# Patient Record
Sex: Female | Born: 1975 | Race: White | Hispanic: No | Marital: Married | Smoking: Current every day smoker
Health system: Southern US, Community
[De-identification: ages and names within clinical notes are randomized; demographics above are authoritative.]

## PROBLEM LIST (undated history)

## (undated) DIAGNOSIS — Z87891 Personal history of nicotine dependence: Secondary | ICD-10-CM

## (undated) DIAGNOSIS — Z22330 Carrier of Group B streptococcus: Secondary | ICD-10-CM

## (undated) DIAGNOSIS — Z3480 Encounter for supervision of other normal pregnancy, unspecified trimester: Secondary | ICD-10-CM

## (undated) DIAGNOSIS — Z98891 History of uterine scar from previous surgery: Secondary | ICD-10-CM

## (undated) DIAGNOSIS — Z8619 Personal history of other infectious and parasitic diseases: Secondary | ICD-10-CM

## (undated) DIAGNOSIS — IMO0002 Reserved for concepts with insufficient information to code with codable children: Secondary | ICD-10-CM

## (undated) DIAGNOSIS — F99 Mental disorder, not otherwise specified: Secondary | ICD-10-CM

## (undated) DIAGNOSIS — F419 Anxiety disorder, unspecified: Secondary | ICD-10-CM

## (undated) DIAGNOSIS — N390 Urinary tract infection, site not specified: Secondary | ICD-10-CM

## (undated) DIAGNOSIS — Z331 Pregnant state, incidental: Secondary | ICD-10-CM

## (undated) DIAGNOSIS — O09529 Supervision of elderly multigravida, unspecified trimester: Secondary | ICD-10-CM

## (undated) HISTORY — DX: History of uterine scar from previous surgery: Z98.891

## (undated) HISTORY — DX: Urinary tract infection, site not specified: N39.0

## (undated) HISTORY — DX: Carrier of group B Streptococcus: Z22.330

## (undated) HISTORY — DX: Encounter for supervision of other normal pregnancy, unspecified trimester: Z34.80

## (undated) HISTORY — DX: Supervision of elderly multigravida, unspecified trimester: O09.529

## (undated) HISTORY — PX: DILATION AND CURETTAGE OF UTERUS: SHX78

## (undated) HISTORY — DX: Reserved for concepts with insufficient information to code with codable children: IMO0002

## (undated) HISTORY — DX: Anxiety disorder, unspecified: F41.9

## (undated) HISTORY — DX: Mental disorder, not otherwise specified: F99

## (undated) HISTORY — DX: Pregnant state, incidental: Z33.1

## (undated) HISTORY — PX: MANDIBLE SURGERY: SHX707

## (undated) HISTORY — DX: Personal history of nicotine dependence: Z87.891

## (undated) HISTORY — DX: Personal history of other infectious and parasitic diseases: Z86.19

---

## 1998-08-28 ENCOUNTER — Emergency Department (HOSPITAL_COMMUNITY): Admission: EM | Admit: 1998-08-28 | Discharge: 1998-08-28 | Payer: Self-pay | Admitting: Emergency Medicine

## 2009-03-24 ENCOUNTER — Inpatient Hospital Stay (HOSPITAL_COMMUNITY): Admission: AD | Admit: 2009-03-24 | Discharge: 2009-03-28 | Payer: Self-pay | Admitting: Obstetrics and Gynecology

## 2010-02-05 ENCOUNTER — Ambulatory Visit (HOSPITAL_COMMUNITY): Admission: RE | Admit: 2010-02-05 | Discharge: 2010-02-05 | Payer: Self-pay | Admitting: Emergency Medicine

## 2010-12-31 LAB — CBC
HCT: 27.9 % — ABNORMAL LOW (ref 36.0–46.0)
HCT: 38.5 % (ref 36.0–46.0)
MCHC: 34.5 g/dL (ref 30.0–36.0)
MCV: 88.7 fL (ref 78.0–100.0)
MCV: 90 fL (ref 78.0–100.0)
Platelets: 178 10*3/uL (ref 150–400)
RBC: 3.1 MIL/uL — ABNORMAL LOW (ref 3.87–5.11)
RDW: 14 % (ref 11.5–15.5)
WBC: 14.6 10*3/uL — ABNORMAL HIGH (ref 4.0–10.5)

## 2010-12-31 LAB — RPR: RPR Ser Ql: NONREACTIVE

## 2011-02-06 NOTE — Discharge Summary (Signed)
NAME:  Cheryl Sanders, Cheryl Sanders       ACCOUNT NO.:  [id number]   MEDICAL RECORD NO.:  [id number]          PATIENT TYPE:  INP   LOCATION:  9105                          FACILITY:  [facility]   PHYSICIAN:  Crist Fat. Rivard, M.D. DATE OF BIRTH:  09-18-1976   DATE OF ADMISSION:  03/24/2009  DATE OF DISCHARGE:  03/28/2009                               DISCHARGE SUMMARY   ADMITTING DIAGNOSES:  1. Intrauterine pregnancy at 41 weeks.  2. Spontaneous rupture of membranes with early labor.  3. Meconium-stained fluid.  4. Positive group Beta streptococcus.   DISCHARGE DIAGNOSES:  1. Intrauterine pregnancy at 41 weeks.  2. Spontaneous rupture of membranes with early labor.  3. Meconium-stained fluid.  4. Positive group Beta streptococcus.  5. Status post a primary low transverse cesarean section for failure      to progress in occiput transverse position with findings of a      viable female infant, born March [birth year], 2009, at 12:37, a female named      Cheryl Sanders and weight was 8 pounds 10 ounces (3920 g), length 20      inches, Apgars were 9 at 1 minute and 9 at 5 minutes.  6. Lactating.  7. History of anxiety, but stable.   HOSPITAL PROCEDURES:  1. Epidural anesthesia.  2. Group B streptococcus prophylaxis in labor with penicillin G IV per      group B streptococcus protocol.  3. Primary low transverse cesarean section.   HOSPITAL COURSE:  Cheryl Sanders is a 35 year old gravida 2, para 0-0-  1-0, who presented on the day of admission at 35 weeks with chief  complaint of spontaneous rupture of membranes around 5:30 a.m. with  light meconium-stained fluid.  She has been having some moderate  cramping, positive fetal movements, small amount of bloody show.  She  had been followed by CNM Service at [facility].  History remarkable for;  1. Positive group beta strep.  2. History of anxiety with no medications.  3. History of polyhydramnios at 28 weeks, which resolved by 32 weeks.   On admission,  spontaneous rupture was confirmed, light meconium-stained  fluid, irritability was noted on toco with some more defined uterine  contractions very occasionally.  Cervix was a loose 1 cm, 80% effaced,  vertex, -2.  Narrow outlet was noted on admission by Nigel Bridgeman,  Certified Nurse Midwife.  She was admitted to birthing suite and plan  was to begin Pitocin augmentation if not in labor over the next 2 hours  and to have penicillin G per GBS prophylactic protocol.  Around 11:40  a.m. on July 1, uterine contractions were irregular in quality and  pattern, but the patient was getting a little bit more uncomfortable.  Fetal heart rate was reactive.  Her vital signs were stable.  She was  afebrile.  Recommended Pitocin augmentation as well as epidural and the  patient was agreeable with plan.  At 2:30 p.m., cervix was unchanged  following epidural placement.  She continued to remain stable and was  afebrile.  Pitocin was on 10 milliunits.  At that time, she was  contracting every 2-5 minutes which were  mild.  Certified Nurse Midwife  did note narrowed ischial spine diameter.  An IUPC was placed at that  time to continue to observe for adequacy.  By 5:00 p.m., the patient was  again checked.  Her contractions were every 1-2 minutes, but  dysfunctional pattern with low amplitude high frequency, did have  slightly elevated resting tone, had coupleting.  Pitocin was on 16  milliunits.  Abdomen was soft, nontender and plan was made to decrease  Pitocin by half at that time.  Her cervix was not checked at that time.  Dr. Pennie Rushing was updated regarding the dysfunctional contraction pattern  and plan was made to continue to observe.  At present, she remained  afebrile and stable and was comfortable with her epidural.  At 6:45  p.m., her Pitocin was on 12 milliunits per minute after cutting in half  and then working way back up.  Her contractions were mild every 2-3  minutes, inadequate MVUs and  continued to monitor.  Around 11:25 p.m.,  the patient was complaining of some rectal pressure.  She was afebrile.  Her vital signs were stable.  Fetal heart rate was in the 145s, very  reactive.  She was contracting every 2-4 minutes, lasting 50-80 seconds.  MVUs were around 120, resting tone was at 20.  Vaginal exam, she was 600  -1 with some bloody show and plan was made to continue to observe for  labor progression and her penicillin G protocol.  At 3:00 a.m., the  patient remained sleep on her left side.  Pitocin was at 22 milliunits  per minute.  Fetal heart rate was still reactive at 140s, and she  continued contracting every 2-4 minutes.  Vaginal exam was deferred, but  MVUs still remain inadequate.  At 5:45 a.m., the patient was awake and  feeling more pressure.  Pitocin was at 26 milliunits per minute.  Fetal  heart rates in the 140s and reactive, contracting every 1-3 minutes.  MVUs were only 80-120.  Vaginal exam 8 cm with more cervix on the right  side, 0 station.  Plan was made to continue Pitocin titration to achieve  adequacy and reevaluate for labor progress.  As the morning progressed  at 11:40 a.m., the patient was complaining of some increasing pain with  lower quadrant with her contractions.  She was afebrile.  Vital signs  were stable.  Her Pitocin at that time was on 30 milliunits.  Fetal  heart rate was reactive.  No decels, 140s, MVUs remained inadequate,  less than 120.  Cervix was unchanged, 7-8 cm, 90%, -1 to 0 station,  irregular contraction pattern every 2-5 minutes.  Cervix still primarily  on right side.  After discussing with the patient and husband that she  had had no cervical change since 5:00 a.m. and dysfunctional contraction  pattern, she had quite a bit of molding, the fetus did have quite a bit  of caput and molding on head, did feel that it may be an OT or OP  presentation, was recommended C-section secondary to failure to  progress.  Risks,  benefits, alternatives, including, but not limited to  risk of bleeding, infection and damage to surrounding organs were  discussed with the patient and husband secondary to failure to progress  and the patient and husband were agreeable to proceed with C-section at  that point, consulted with Dr. Stefano Gaul.  Dr. Stefano Gaul came to bedside.  Further discussed the procedure and the patient was prepped and taken  to  the OR where procedure was performed with Dr. Leonard Schwartz as  attending, assisted by Denny Levy, Certified Nurse Midwife.  Procedure, primary low transverse cesarean section for failure to  progress, postoperative diagnosis of OT presentation, and findings were  a viable female infant by the name of Cheryl Sanders and weight was 8  pounds 10 ounces, born 12:37, Apgars 9 at 1 minute and 9 at 5 minutes  with length of 20 inches.  The patient tolerated the procedure very  well.  Estimated blood loss 800 mL.  Please see operative note for any  further details.  The patient tolerated the procedure very well, was  taken to PACU in good condition and newborn to Full-Term Nursery.  By  postoperative #1, the patient was doing well, had been up out of bed.  Breastfeeding was going slowly, but the patient determined that did have  itch on side of abdomen which was present before delivery.  She was  afebrile.  Vital signs were stable.  Hemoglobin was down to 9.7 from  13.3, white count was 11.1 that had been 14.6 and platelets were stable  at 178 that had been 236.  Physical exam was within normal limits.  Lungs were clear.  Abdomen is soft, appropriately tender.  She had an  area of a rash above the dressing, which was possibly related to draping  in the OR.  Incision was clean, dry and intact.  Extremities within  normal limits.  She was diagnosed with some contact dermatitis,  hydrocortisone cream was ordered to bedside and C-section pathway was  continued.  On postop day #2,  she was doing okay, little more sore that  day, but Percocet and Motrin were helping and the patient was undecided  regarding contraception and is to consider over the next 6 weeks.  She  remained afebrile.  Vital signs were stable.  Physical exam was within  normal limits.  The rash was improving on using hydrocortisone cream.  Fundus was firm below umbilicus.  Incision was clean, dry and intact.  She had scant rubra lochia and extremities were within normal limits.  Routine postpartum care was continued.  On postoperative #3, she was  doing much better, breastfeeding was getting easier and the patient  pumping was good, even post-feed output.  She had a bowel movement on  postoperative day #2.  She was tolerating her diet, voiding without  difficulty, up without dizziness and was ready for her discharge.  She  was afebrile.  Vital signs remained stable.  Physical exam was within  normal limits and the patient was deemed to have received full benefit  of her hospital stay and was discharged home in stable condition on  postoperative day #3.   DISCHARGE INSTRUCTIONS:  Per [facility] pamphlet.  Warning signs and symptoms  to report were reviewed.   FOLLOWUP:  To occur in 6 weeks at [facility] or as needed.  We will discuss contraception at that point.   DISCHARGE MEDICATIONS:  1. Motrin 600 mg p.o. q.6 h. p.r.n. pain.  2. Percocet 5/325 one to two tabs p.o. q.4-6 h. p.r.n. moderate-to-      severe pain.  3. Prenatal vitamin 1 tab p.o. daily.  4. Iron supplement 1 tab p.o. daily.  5. Colace 1-2 tabs p.o. daily.  6. MiraLax 17 g p.o. if no bowel movement every 3 days, to take it      daily until bowel movement.  Candice Walton, CNM      Dois Davenport A. Rivard, M.D.  Electronically Signed    CHS/MEDQ  D:  03/28/2009  T:  03/29/2009  Job:  [id number]

## 2011-02-06 NOTE — H&P (Signed)
NAME:  TARISSA, KERIN       ACCOUNT NO.:  [id number]   MEDICAL RECORD NO.:  [id number]          PATIENT TYPE:  INP   LOCATION:  9170                          FACILITY:  [facility]   PHYSICIAN:  Dois Davenport A. Rivard, M.D. DATE OF BIRTH:  14-Dec-1975   DATE OF ADMISSION:  03/24/2009  DATE OF DISCHARGE:                              HISTORY & PHYSICAL   Ms. Neave-Johnson is a 34 year old, gravida 2, para 0-0-1-0, at 41 weeks  who presented complaining of spontaneous rupture of membranes at  approximately 5:30 a.m. with light meconium-stained fluid noted.  She is  also noted be having some moderate cramping.  She reports positive fetal  movement and a small amount of bloody show.   Pregnancy has been remarkable for:  1. Positive group B strep.  2. History of anxiety with no medications.  3. History of polyhydramnios at 28 weeks but resolved by 32 weeks.   PRENATAL LABORATORIES:  Blood type is O+, Rh antibody negative, VDRL  nonreactive, rubella titer positive, hepatitis B surface antigen  negative.  HIV is not noted on her chart.  Pap test was normal.  GC and  Chlamydia cultures were negative in December.  Cystic fibrosis testing  was declined.  First trimester screen was declined.  Quadruple screen  was declined.  Glucola was elevated at 139.  A 3-hour GTT was normal.  Hemoglobin upon entering the practice was 14.6; it was 12.8 at 28 weeks.  Group B strep culture and other cultures were done at 36 weeks.  Group B  strep culture was positive; others were negative.   HISTORY OF PRESENT PREGNANCY:  The patient entered care at approximately  10 weeks.  She declined first trimester and second trimester screening.  Ultrasound at 19 weeks showed an [facility] of March 17, 2009.  This was 1 week  different from her LMP dating, and her last LMP was not normal.  Therefore, March 17, 2009 was established at her [facility].  Placenta was  anterior.  Cervical length was normal.  She had another ultrasound at 27  weeks to confirm growth.  Estimated fetal weight was at the 78th  percentile, but amniotic fluid volume was greater than the 97th  percentile.  She also at that time had an elevated Glucola of 139.  A 3-  hour GTT was normal.  She had another ultrasound at 31-32 weeks.  Ultrasound showed normal fluid and a normal cervical length.  Group B  strep was positive at 36 weeks.  Cultures were negative.  She had a mild  elevation of her blood pressure at 37 weeks to 140/80.  PIH labs were  done and were normal.  A 24-hour urine was negative.  The rest her  pregnancy was essentially uncomplicated.  She had an NST scheduled at 41  weeks, but she is now here in labor.   OBSTETRICAL HISTORY:  In 1998, she had a miscarriage.   MEDICAL HISTORY:  1. She is a NuvaRing, Ortho Tri-Cyclen, condom and withdrawal user.  2. She reports the usual childhood illnesses.  3. She had a history of a heart murmur as a child and was evaluated as  a teenager, but has had no disease and no murmurs have been noted      since that time.  4. She has self-diagnosed lactose intolerance.  5. She has some heartburn.  6. Does have a history of anxiety, but has never had any medication.   SURGICAL HISTORY:  1. Wisdom teeth in 1993.  2. Jaw surgery in 1995.  3. A D&C for a miscarriage in 1998.   ALLERGIES:  She has no known medication allergies.   FAMILY HISTORY:  Her sister and father have hypertension.  Her father  has varicose veins.  Her maternal grandfather has emphysema.  Her  maternal grandmother is hypothyroid.  Her mother is hyperthyroid.  Sister has some type of kidney issue.  Maternal grandmother had a  stroke.  Maternal grandmother had rheumatoid arthritis.  Her father has  panic attacks.  Her mother is a smoker.  Maternal uncle is an alcoholic,  and paternal aunt is an alcoholic.  Genetic history is unremarkable.   SOCIAL HISTORY:  The patient is married to the father of the baby.  He  is involved and  supportive.  His name is Doristine Section.  The patient has  a bachelor's degree plus.  She is a Scientist, clinical (histocompatibility and immunogenetics).  Her husband also  has a bachelor's degree plus, and he is Buyer, retail.  She is  employed part-time, he is employed full-time.  She is Caucasian.  She  denies a religious affiliation.  She has been followed by the certified  nurse midwife service at [facility].  She denies any alcohol,  drug or tobacco use during this pregnancy.   PHYSICAL EXAMINATION:  VITAL SIGNS:  Stable.  The patient is febrile.  HEENT:  Within normal limits.  LUNGS:  Breath sounds are clear.  HEART:  Regular rate and rhythm without murmur.  BREASTS:  Soft and nontender.  ABDOMEN:  Fundal height is approximately 39 cm.  Estimated fetal weight  is 7 to 7-1/2 pounds.  Uterine contractions are showing primarily  irritability with some sporadic more organized contractions.  Fetal  heart rate is currently reactive.  PELVIC:  The patient is noted to be leaking light meconium-stained  fluid.  Cervix is a loose 1 cm, 80% vertex at a minus two station with a  relatively narrow outlet.  EXTREMITIES:  Deep tendon reflexes are 2+ without clonus.  There is a  trace edema noted.   IMPRESSION:  1. Intrauterine pregnancy at 41 weeks.  2. Spontaneous rupture of membranes with early labor.  3. Positive group B strep.   PLAN:  1. Admit to birthing suite for consult with Dr. Pennie Rushing as attending      physician.  2. Routine certified nurse midwife orders.  3. Plan Pitocin augmentation if no increase in labor over the next 2      hours, and plan epidural p.r.n.  4. Group B strep treatment per protocol with penicillin Lorenda Hatchet L. Emilee Hero, C.N.M.      Crist Fat Rivard, M.D.  Electronically Signed    VLL/MEDQ  D:  03/24/2009  T:  03/24/2009  Job:  [id number]

## 2011-02-06 NOTE — Op Note (Signed)
NAME:  Cheryl Sanders, Cheryl Sanders       ACCOUNT NO.:  [id number]   MEDICAL RECORD NO.:  [id number]          PATIENT TYPE:  INP   LOCATION:  9105                          FACILITY:  [facility]   PHYSICIAN:  Janine Limbo, M.D.DATE OF BIRTH:  Oct 01, 1975   DATE OF PROCEDURE:  03/25/2009  DATE OF DISCHARGE:                               OPERATIVE REPORT   PREOPERATIVE DIAGNOSES:  1. Term intrauterine gestation.  2. Failure to progress in labor.   POSTOPERATIVE DIAGNOSES:  1. Term intrauterine gestation.  2. Failure to progress in labor.  3. Left occiput transverse presentation.   PROCEDURE:  Primary low-transverse cesarean section.   SURGEON:  Janine Limbo, MD   FIRST ASSISTANT:  Larna Daughters, certified nurse midwife.   ANESTHETIC:  Epidural.   DISPOSITION:  Cheryl Sanders is a 35 year old female, gravida 2, para  0-0-1-0, who presents at [redacted] weeks gestation.  She was admitted on March 24, 2009 with ruptured membranes.  The patient did labor and dilated her  cervix to 8 cm.  However, she was unable to dilate her cervix any  further.  We discussed the management options.  We also reviewed the  risks and benefits associated with those options.  The patient elected  to proceed with cesarean delivery.  The specific risks were reviewed,  including, but not limited to, anesthetic complications, bleeding,  infections, and possible damage to the surrounding organs.   FINDINGS:  An 8 pound 5 ounce female infant Cheryl Sanders) was delivered from a  left occiput transverse presentation.  The Apgars scores were 8 at 1  minute and 9 at 5 minutes.  The uterus, fallopian tubes, and ovaries  appeared normal for the gravid state.   PROCEDURE:  The patient was taken to the operating room, where was  determined that the epidural she had received for labor would be  adequate for cesarean delivery.  The patient's abdomen was prepped with  multiple layers of Betadine.  A Foley catheter had  previously been  placed.  The patient was sterilely draped.  The lower abdomen was  injected with 10 mL of 0.5% Marcaine with epinephrine.  A low transverse  incision was made in the abdomen and carried sharply through the  subcutaneous tissue, fascia, and the anterior peritoneum.  An incision  was made in the lower uterine segment and extended in a low-transverse  fashion.  The fetal head was delivered without difficulty.  Meconium  fluid was noted.  The mouth and nose were suctioned using DeLee trap and  the bulb suction.  The remainder of the infant was then delivered.  The  cord was clamped and cut and the infant was handed to the awaiting  pediatric team.  Routine cord blood studies were obtained.  The placenta  was removed.  The uterine cavity was cleaned of amniotic fluid, clotted  blood, and membranes.  The uterine incision was closed using a running  locking suture of 2-0 Vicryl followed by an imbricating suture of 2-0  Vicryl.  The bladder flap was closed using a running suture of 3-0  Vicryl.  The pelvis was vigorously irrigated.  Hemostasis was  confirmed.  The anterior peritoneum was closed using a running suture of 3-0 Vicryl.  The abdominal musculature was reapproximated in the midline using 2-0  Vicryl.  The fascia was closed using a running suture of 0 Vicryl  followed by 3 interrupted sutures of 0 Vicryl.  The subcutaneous layer  was closed using a running suture of 3-0 Vicryl.  The skin was  reapproximated using a subcuticular suture of 3-0 Monocryl.  Sponge,  needle, and instrument counts were correct on 2 occasions.  Estimated  blood loss throughout the procedure was 800 mL.  The patient tolerated  her procedure well.  She was transported to the recovery room in stable  condition.  The infant was taken to the full-term nursery in stable  condition.  The placenta was sent to labor and delivery.      Janine Limbo, M.D.  Electronically Signed     AVS/MEDQ   D:  03/25/2009  T:  03/26/2009  Job:  [id number]

## 2011-05-21 IMAGING — CT CT HEAD W/O CM
1 series · 16 of 30 positions shown, 20 images · non-contrast
Comparison: None.

CLINICAL DATA: Headaches.  Syncope.

CT HEAD WITHOUT CONTRAST
TECHNIQUE: Contiguous axial images were obtained from the base of
the skull through the vertex without contrast.

[Series 2: brain · axial · 0.47mm/px · z∈[+163,+300]mm · 16 of 30 slices shown, 20 images]
[im 2/30  brain]
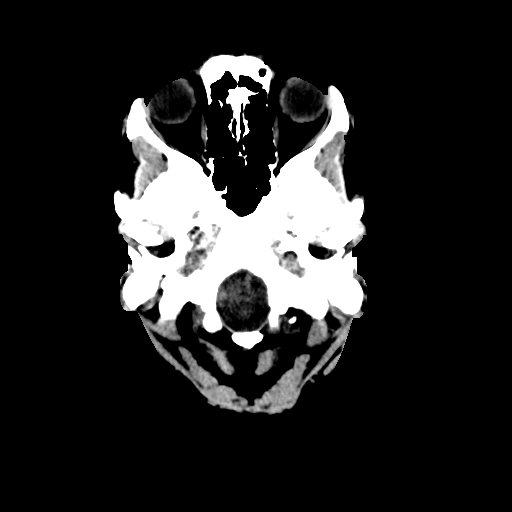
[im 2/30  bone]
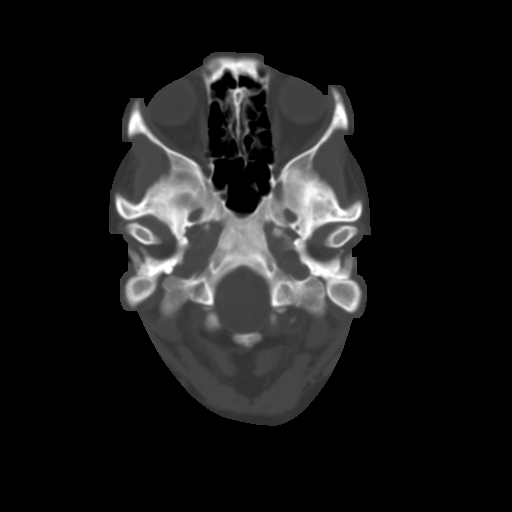
[im 4/30  brain]
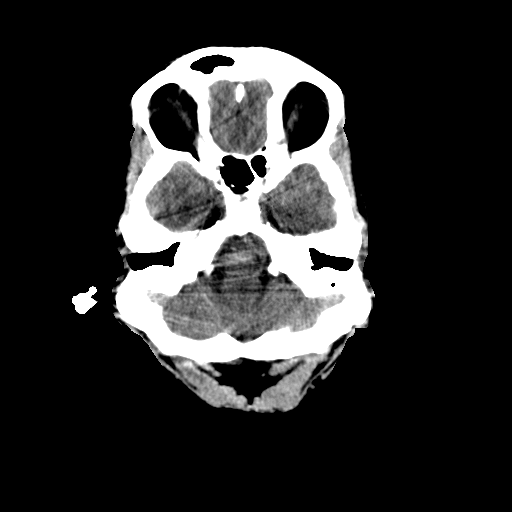
[im 6/30  brain]
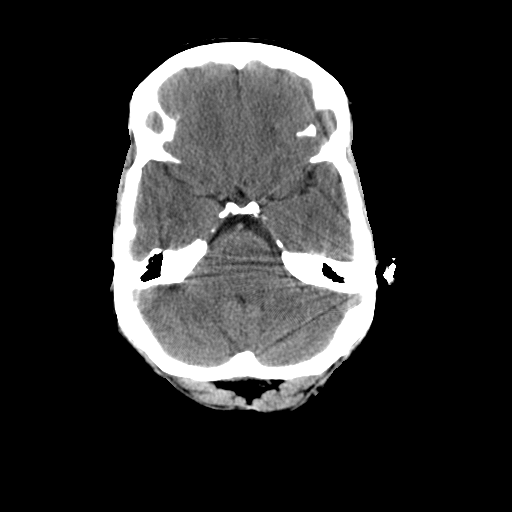
[im 8/30  brain]
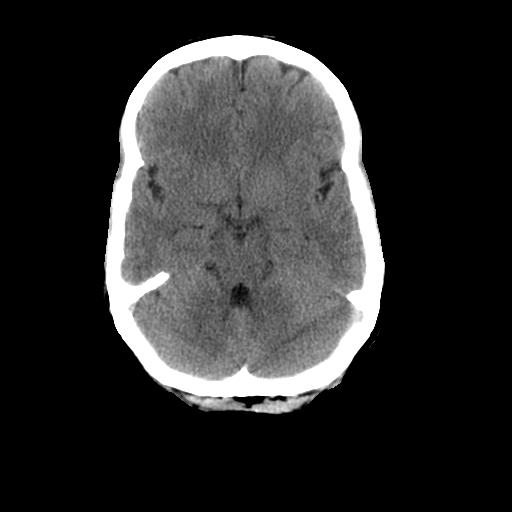
[im 9/30  brain]
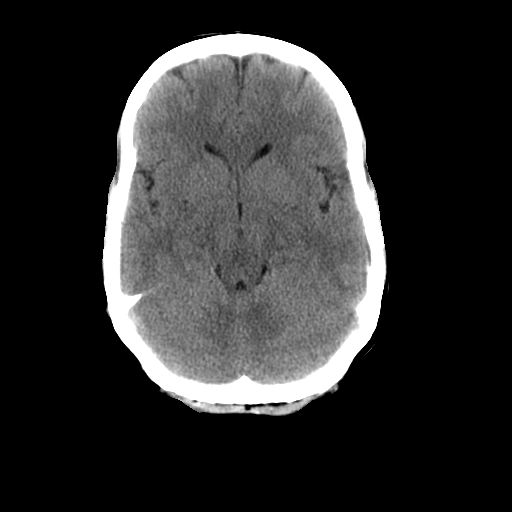
[im 9/30  bone]
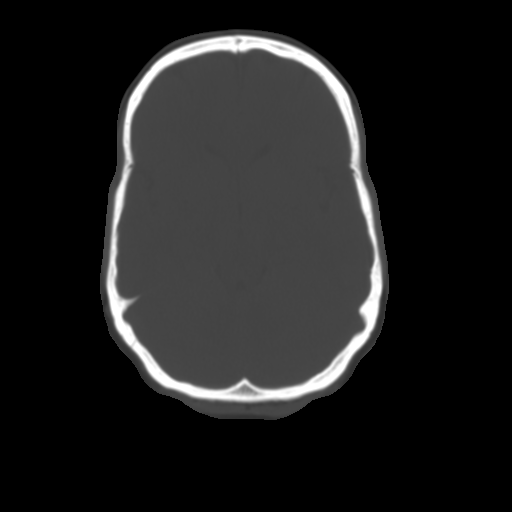
[im 11/30  brain]
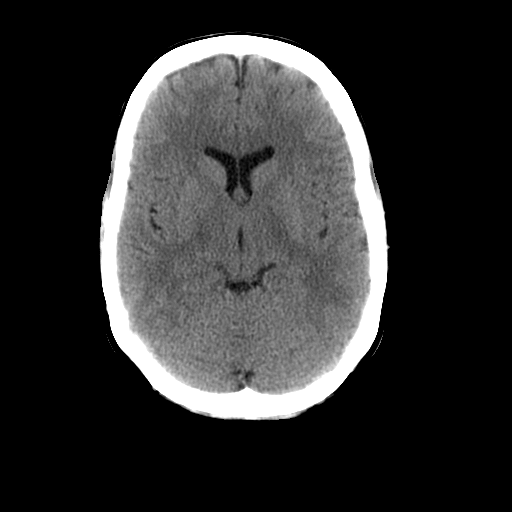
[im 13/30  brain]
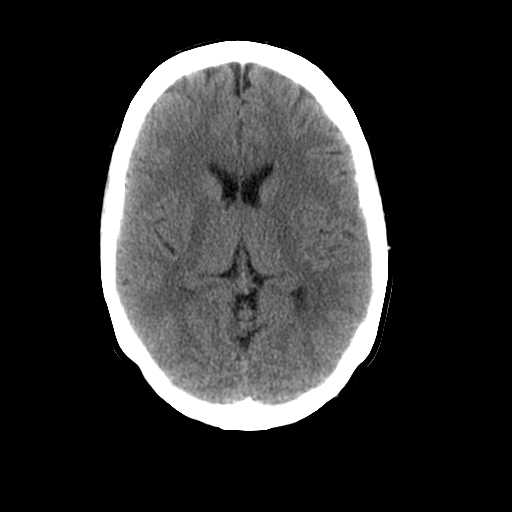
[im 15/30  brain]
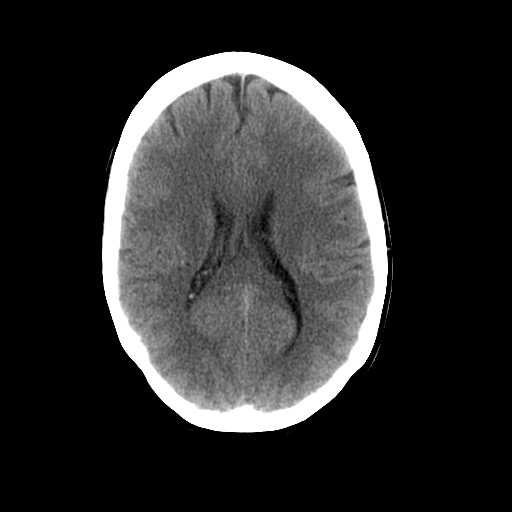
[im 16/30  brain]
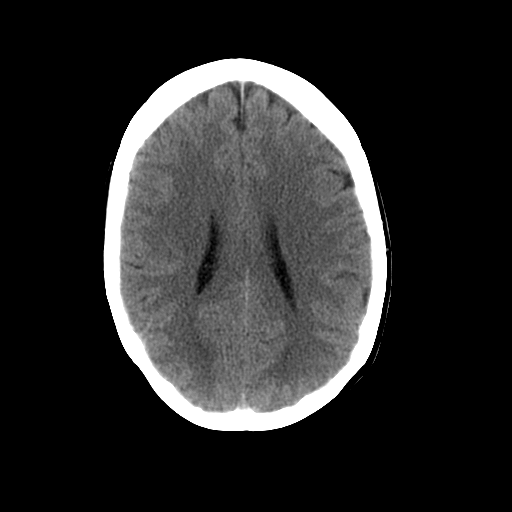
[im 16/30  bone]
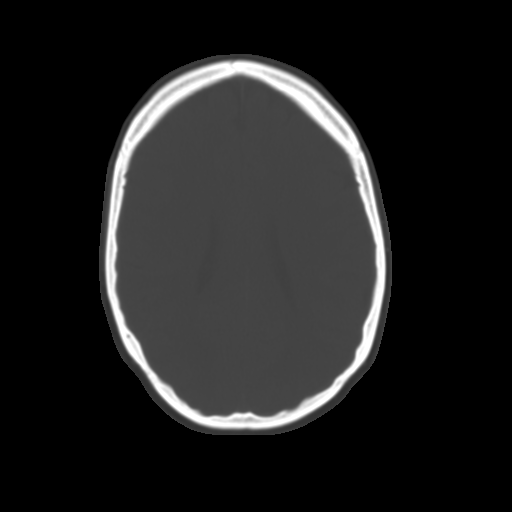
[im 18/30  brain]
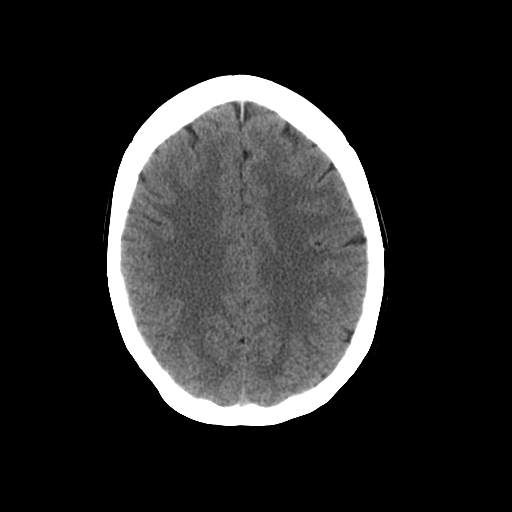
[im 20/30  brain]
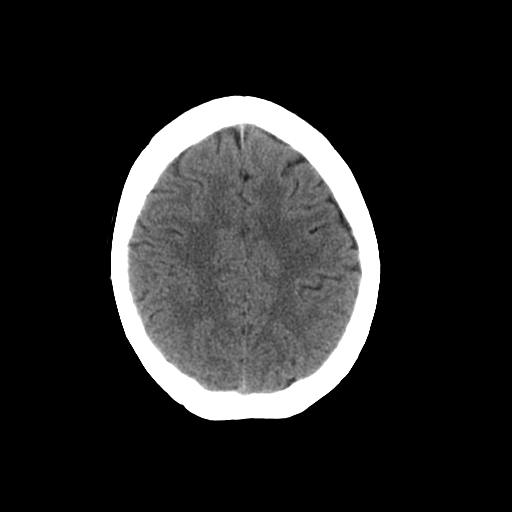
[im 22/30  brain]
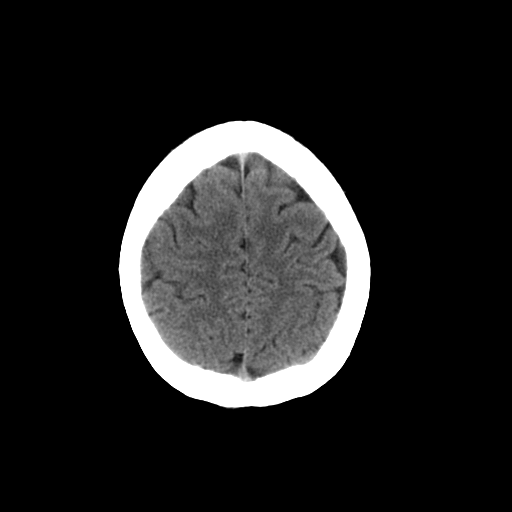
[im 23/30  brain]
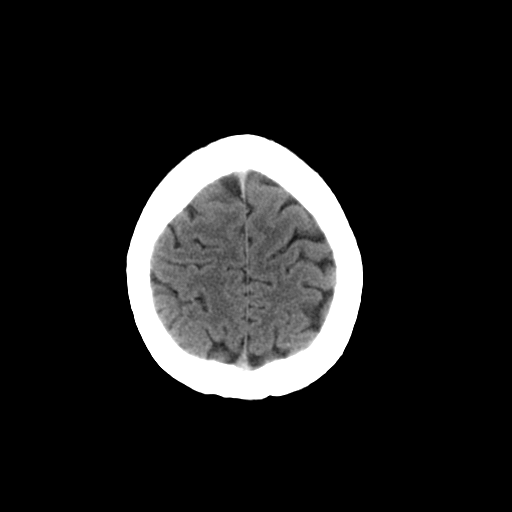
[im 23/30  bone]
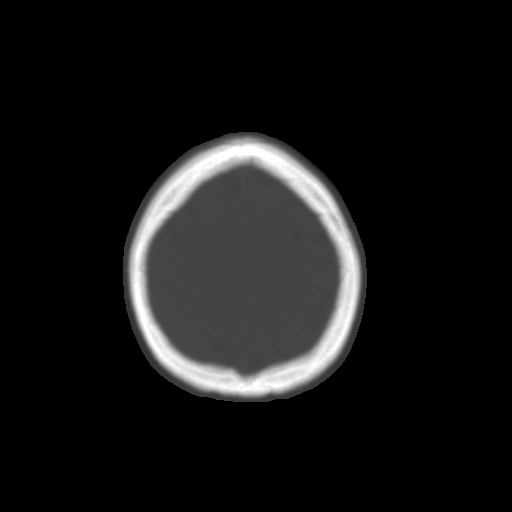
[im 25/30  brain]
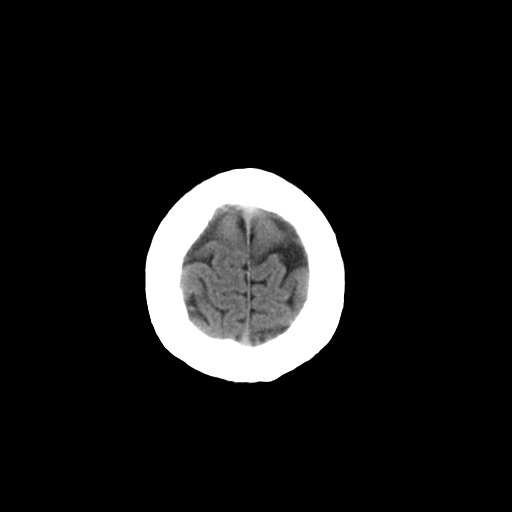
[im 27/30  brain]
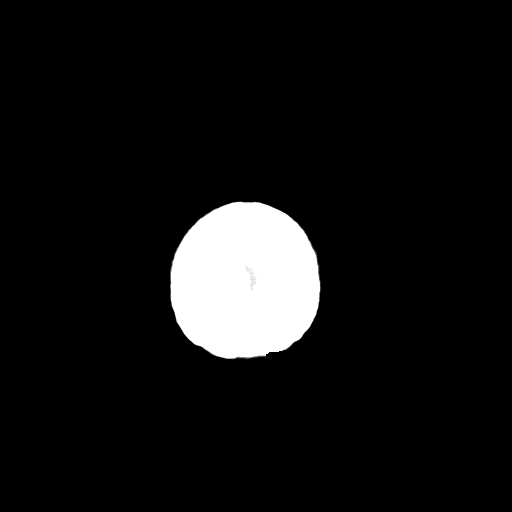
[im 29/30  brain]
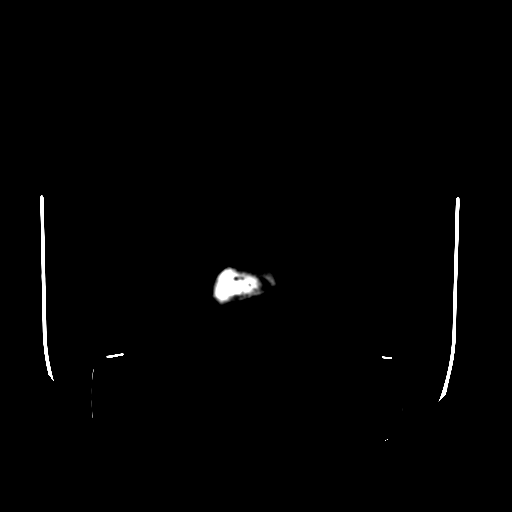

[16 of 30 positions shown; findings below may reference images not displayed]

FINDINGS: Streak artifact from the patient's earrings noted (the
patient refused to remove the rings).

The brain stem, cerebellum, cerebral peduncles, thalami, basal
ganglia, basilar cisterns, and ventricular system appear
unremarkable.

No intracranial hemorrhage, mass lesion, or acute infarction is
identified.

Mild chronic ethmoid sinusitis noted.
IMPRESSION: 1.  Mild chronic ethmoid sinusitis.

## 2011-10-18 LAB — OB RESULTS CONSOLE ABO/RH: RH Type: POSITIVE

## 2011-10-18 LAB — OB RESULTS CONSOLE GC/CHLAMYDIA
Chlamydia: NEGATIVE
Gonorrhea: NEGATIVE

## 2011-10-18 LAB — OB RESULTS CONSOLE RPR: RPR: NONREACTIVE

## 2011-10-18 LAB — OB RESULTS CONSOLE RUBELLA ANTIBODY, IGM: Rubella: IMMUNE

## 2011-10-18 LAB — OB RESULTS CONSOLE HEPATITIS B SURFACE ANTIGEN: Hepatitis B Surface Ag: NEGATIVE

## 2011-12-13 ENCOUNTER — Encounter (INDEPENDENT_AMBULATORY_CARE_PROVIDER_SITE_OTHER): Payer: 59 | Admitting: Obstetrics and Gynecology

## 2011-12-13 DIAGNOSIS — Z3480 Encounter for supervision of other normal pregnancy, unspecified trimester: Secondary | ICD-10-CM

## 2011-12-25 ENCOUNTER — Other Ambulatory Visit: Payer: Self-pay

## 2011-12-25 DIAGNOSIS — Z3689 Encounter for other specified antenatal screening: Secondary | ICD-10-CM

## 2012-01-03 ENCOUNTER — Ambulatory Visit (INDEPENDENT_AMBULATORY_CARE_PROVIDER_SITE_OTHER): Payer: 59

## 2012-01-03 ENCOUNTER — Encounter: Payer: Self-pay | Admitting: Obstetrics and Gynecology

## 2012-01-03 ENCOUNTER — Telehealth: Payer: Self-pay | Admitting: Obstetrics and Gynecology

## 2012-01-03 ENCOUNTER — Ambulatory Visit (INDEPENDENT_AMBULATORY_CARE_PROVIDER_SITE_OTHER): Payer: 59 | Admitting: Obstetrics and Gynecology

## 2012-01-03 ENCOUNTER — Other Ambulatory Visit: Payer: 59

## 2012-01-03 VITALS — BP 114/64 | Ht 60.0 in | Wt 154.0 lb

## 2012-01-03 DIAGNOSIS — Z9889 Other specified postprocedural states: Secondary | ICD-10-CM

## 2012-01-03 DIAGNOSIS — O09529 Supervision of elderly multigravida, unspecified trimester: Secondary | ICD-10-CM

## 2012-01-03 DIAGNOSIS — N2889 Other specified disorders of kidney and ureter: Secondary | ICD-10-CM

## 2012-01-03 DIAGNOSIS — N1330 Unspecified hydronephrosis: Secondary | ICD-10-CM

## 2012-01-03 DIAGNOSIS — Z98891 History of uterine scar from previous surgery: Secondary | ICD-10-CM | POA: Insufficient documentation

## 2012-01-03 DIAGNOSIS — Z3689 Encounter for other specified antenatal screening: Secondary | ICD-10-CM

## 2012-01-03 HISTORY — DX: Supervision of elderly multigravida, unspecified trimester: O09.529

## 2012-01-03 LAB — US OB DETAIL + 14 WK

## 2012-01-03 NOTE — Progress Notes (Signed)
U/S Reviewed - c/w dates, no anomalies, nl fluid, ant fundal grade 0 placenta, female, bil pylectasis, nl ovaries, cvx 3.7cm, 3V cord No complaints Questions answered Repeat u/s to f/u bil pyelectasis at [place].  If persists and stable, repeat in 3rd trimester.  If worsening, refer to MFM. Please discuss delivery plans at [place] (VBAC vs Repeat c/s) RTO 4wks

## 2012-01-04 ENCOUNTER — Other Ambulatory Visit: Payer: 59

## 2012-01-04 ENCOUNTER — Encounter: Payer: 59 | Admitting: Obstetrics and Gynecology

## 2012-01-04 NOTE — Telephone Encounter (Signed)
TC to pt. LM to return call.  

## 2012-01-08 NOTE — Telephone Encounter (Signed)
No response from pt. F/U ended. 

## 2012-01-16 ENCOUNTER — Telehealth: Payer: Self-pay | Admitting: Obstetrics and Gynecology

## 2012-01-16 NOTE — Telephone Encounter (Signed)
Routed to jackie  

## 2012-01-16 NOTE — Telephone Encounter (Signed)
TC to pt.   Requesting Dental note be faxed to Dr. Cherlynn Perches, [phone].  Fax sent.

## 2012-01-17 ENCOUNTER — Telehealth: Payer: Self-pay | Admitting: Obstetrics and Gynecology

## 2012-01-17 NOTE — Telephone Encounter (Signed)
Routed to jackie  

## 2012-01-18 ENCOUNTER — Telehealth: Payer: Self-pay

## 2012-01-18 NOTE — Telephone Encounter (Signed)
Cheryl Sanders faxed dental note per pt request. [place] Erp

## 2012-01-18 NOTE — Telephone Encounter (Signed)
Spoke to pt to let her know she can have dental work whenever necessary in pregnancy and that the dental note that she requested has been faxed.Cheryl Sanders A

## 2012-01-30 ENCOUNTER — Ambulatory Visit (INDEPENDENT_AMBULATORY_CARE_PROVIDER_SITE_OTHER): Payer: 59

## 2012-01-30 ENCOUNTER — Ambulatory Visit (INDEPENDENT_AMBULATORY_CARE_PROVIDER_SITE_OTHER): Payer: 59 | Admitting: Obstetrics and Gynecology

## 2012-01-30 VITALS — BP 102/62 | Wt 165.0 lb

## 2012-01-30 DIAGNOSIS — N2889 Other specified disorders of kidney and ureter: Secondary | ICD-10-CM

## 2012-01-30 DIAGNOSIS — Z9889 Other specified postprocedural states: Secondary | ICD-10-CM

## 2012-01-30 DIAGNOSIS — Z3480 Encounter for supervision of other normal pregnancy, unspecified trimester: Secondary | ICD-10-CM

## 2012-01-30 DIAGNOSIS — Z98891 History of uterine scar from previous surgery: Secondary | ICD-10-CM

## 2012-01-30 DIAGNOSIS — N1330 Unspecified hydronephrosis: Secondary | ICD-10-CM

## 2012-01-30 HISTORY — DX: Encounter for supervision of other normal pregnancy, unspecified trimester: Z34.80

## 2012-01-30 LAB — US OB FOLLOW UP

## 2012-01-30 NOTE — Progress Notes (Signed)
No complaints U/S AGA, bil pyelectasis is stable. Normal fluid. Repeat u/s around 30wks Glucola at [place] Travel Precautions discussed.  Planning trip to [place] RTO 4wks

## 2012-01-31 ENCOUNTER — Encounter: Payer: 59 | Admitting: Obstetrics and Gynecology

## 2012-02-28 ENCOUNTER — Other Ambulatory Visit: Payer: 59

## 2012-02-28 ENCOUNTER — Ambulatory Visit (INDEPENDENT_AMBULATORY_CARE_PROVIDER_SITE_OTHER): Payer: 59 | Admitting: Obstetrics and Gynecology

## 2012-02-28 VITALS — BP 110/64 | Wt 171.0 lb

## 2012-02-28 DIAGNOSIS — Z331 Pregnant state, incidental: Secondary | ICD-10-CM

## 2012-02-28 DIAGNOSIS — N1330 Unspecified hydronephrosis: Secondary | ICD-10-CM

## 2012-02-28 DIAGNOSIS — O09529 Supervision of elderly multigravida, unspecified trimester: Secondary | ICD-10-CM

## 2012-02-28 DIAGNOSIS — N2889 Other specified disorders of kidney and ureter: Secondary | ICD-10-CM

## 2012-02-28 LAB — CBC
HCT: 37.6 % (ref 36.0–46.0)
Hemoglobin: 12.7 g/dL (ref 12.0–15.0)
MCH: 29.5 pg (ref 26.0–34.0)
MCV: 87.2 fL (ref 78.0–100.0)
Platelets: 218 10*3/uL (ref 150–400)
RBC: 4.31 MIL/uL (ref 3.87–5.11)
WBC: 9.3 10*3/uL (ref 4.0–10.5)

## 2012-02-28 NOTE — Progress Notes (Signed)
Glucola today. Blood type O+ Pt desires VBAC consent given Ultrasound at next visit for follow-up pyelectasis

## 2012-02-28 NOTE — Progress Notes (Signed)
Pt. Stated she is starting to get a rash on chest area , no other issues today  / glucola given @ 9:16 Draw @ 10:16.

## 2012-02-29 LAB — GLUCOSE TOLERANCE, 1 HOUR: Glucose, 1 Hour GTT: 113 mg/dL (ref 70–140)

## 2012-02-29 LAB — RPR

## 2012-03-12 ENCOUNTER — Ambulatory Visit (INDEPENDENT_AMBULATORY_CARE_PROVIDER_SITE_OTHER): Payer: 59 | Admitting: Obstetrics and Gynecology

## 2012-03-12 ENCOUNTER — Encounter: Payer: Self-pay | Admitting: Obstetrics and Gynecology

## 2012-03-12 ENCOUNTER — Ambulatory Visit (INDEPENDENT_AMBULATORY_CARE_PROVIDER_SITE_OTHER): Payer: 59

## 2012-03-12 VITALS — BP 132/70 | Wt 174.0 lb

## 2012-03-12 DIAGNOSIS — N1330 Unspecified hydronephrosis: Secondary | ICD-10-CM

## 2012-03-12 DIAGNOSIS — IMO0002 Reserved for concepts with insufficient information to code with codable children: Secondary | ICD-10-CM

## 2012-03-12 DIAGNOSIS — N2889 Other specified disorders of kidney and ureter: Secondary | ICD-10-CM

## 2012-03-12 DIAGNOSIS — O3660X0 Maternal care for excessive fetal growth, unspecified trimester, not applicable or unspecified: Secondary | ICD-10-CM

## 2012-03-12 NOTE — Progress Notes (Signed)
Doing well. [place] today:  [redacted]w[redacted]d (2 weeks ahead of EDC of 06/03/12), EFW 3+10, 88%ile.  Vtx, AFI 16.9, 65%ile.  Pyelectasis resolved. Cervix closed. Reviewed findings.  Will repeat US for growth in 4 weeks. If LGA persists, patient may elect to schedule repeat C/S, but still considering VBAC. VBAC consent signed today.

## 2012-03-12 NOTE — Progress Notes (Signed)
Pt without complaints today Pt unable to void

## 2012-03-13 LAB — US OB FOLLOW UP

## 2012-03-26 ENCOUNTER — Encounter: Payer: Self-pay | Admitting: Obstetrics and Gynecology

## 2012-03-26 ENCOUNTER — Ambulatory Visit (INDEPENDENT_AMBULATORY_CARE_PROVIDER_SITE_OTHER): Payer: 59 | Admitting: Obstetrics and Gynecology

## 2012-03-26 VITALS — BP 120/70 | Wt 178.0 lb

## 2012-03-26 DIAGNOSIS — Z331 Pregnant state, incidental: Secondary | ICD-10-CM

## 2012-03-26 NOTE — Progress Notes (Signed)
LGA at 88% for VBAC No complaints 1 hr glucola normal Hgb 12.7  RPR negative

## 2012-03-26 NOTE — Progress Notes (Signed)
Pt feels okay today

## 2012-04-07 ENCOUNTER — Ambulatory Visit (INDEPENDENT_AMBULATORY_CARE_PROVIDER_SITE_OTHER): Payer: 59 | Admitting: Obstetrics and Gynecology

## 2012-04-07 VITALS — BP 112/62 | Wt 179.0 lb

## 2012-04-07 DIAGNOSIS — Z331 Pregnant state, incidental: Secondary | ICD-10-CM

## 2012-04-07 NOTE — Progress Notes (Addendum)
doing well. wants VBAC. Wants a BTL if husband does not schedule vasectomy. Return to office in 2 weeks. Ultrasound at 36 weeks. Dr. Stefano Gaul

## 2012-04-07 NOTE — Progress Notes (Signed)
Pt stated no issues today.  

## 2012-04-23 ENCOUNTER — Encounter: Payer: Self-pay | Admitting: Obstetrics and Gynecology

## 2012-04-23 ENCOUNTER — Ambulatory Visit (INDEPENDENT_AMBULATORY_CARE_PROVIDER_SITE_OTHER): Payer: 59 | Admitting: Obstetrics and Gynecology

## 2012-04-23 VITALS — BP 110/62 | Wt 184.0 lb

## 2012-04-23 DIAGNOSIS — Z331 Pregnant state, incidental: Secondary | ICD-10-CM

## 2012-04-23 DIAGNOSIS — Z3490 Encounter for supervision of normal pregnancy, unspecified, unspecified trimester: Secondary | ICD-10-CM

## 2012-04-23 NOTE — Progress Notes (Signed)
[redacted]w[redacted]d No complaints No change in vaginal secretions Desires VBAC To have USS at 38 weeks for growth (EfF) as  Per Dr Stefano Gaul

## 2012-04-23 NOTE — Progress Notes (Signed)
No complaints

## 2012-05-06 ENCOUNTER — Ambulatory Visit (INDEPENDENT_AMBULATORY_CARE_PROVIDER_SITE_OTHER): Payer: 59 | Admitting: Obstetrics and Gynecology

## 2012-05-06 VITALS — BP 118/68 | Wt 184.0 lb

## 2012-05-06 DIAGNOSIS — Z331 Pregnant state, incidental: Secondary | ICD-10-CM

## 2012-05-06 DIAGNOSIS — Z3490 Encounter for supervision of normal pregnancy, unspecified, unspecified trimester: Secondary | ICD-10-CM

## 2012-05-06 NOTE — Progress Notes (Signed)
[redacted]w[redacted]d Doing well. Some contractions. Beta strep sent. Return office in 1 week. VBAC discussed. Dr. Stefano Gaul

## 2012-05-06 NOTE — Progress Notes (Signed)
Pt stated wanted to talk about contractions at todays visit .

## 2012-05-14 ENCOUNTER — Encounter: Payer: Self-pay | Admitting: Obstetrics and Gynecology

## 2012-05-14 ENCOUNTER — Ambulatory Visit (INDEPENDENT_AMBULATORY_CARE_PROVIDER_SITE_OTHER): Payer: 59 | Admitting: Obstetrics and Gynecology

## 2012-05-14 VITALS — BP 114/74 | Wt 185.0 lb

## 2012-05-14 DIAGNOSIS — Z3480 Encounter for supervision of other normal pregnancy, unspecified trimester: Secondary | ICD-10-CM

## 2012-05-14 NOTE — Progress Notes (Signed)
A/P GBS negative Fetal kick counts reviewed Labor reviewed with pt All patients  questions answered Pt desires to VBAC consent signed

## 2012-05-14 NOTE — Patient Instructions (Signed)
Fetal Movement Counts Patient Name: __________________________________________________ Patient Due Date: ____________________ Kick counts is highly recommended in high risk pregnancies, but it is a good idea for every pregnant woman to do. Start counting fetal movements at 28 weeks of the pregnancy. Fetal movements increase after eating a full meal or eating or drinking something sweet (the blood sugar is higher). It is also important to drink plenty of fluids (well hydrated) before doing the count. Lie on your left side because it helps with the circulation or you can sit in a comfortable chair with your arms over your belly (abdomen) with no distractions around you. DOING THE COUNT  Try to do the count the same time of day each time you do it.   Mark the day and time, then see how long it takes for you to feel 10 movements (kicks, flutters, swishes, rolls). You should have at least 10 movements within 2 hours. You will most likely feel 10 movements in much less than 2 hours. If you do not, wait an hour and count again. After a couple of days you will see a pattern.   What you are looking for is a change in the pattern or not enough counts in 2 hours. Is it taking longer in time to reach 10 movements?  SEEK MEDICAL CARE IF:  You feel less than 10 counts in 2 hours. Tried twice.   No movement in one hour.   The pattern is changing or taking longer each day to reach 10 counts in 2 hours.   You feel the baby is not moving as it usually does.  Date: ____________ Movements: ____________ Start time: ____________ Finish time: ____________  Date: ____________ Movements: ____________ Start time: ____________ Finish time: ____________ Date: ____________ Movements: ____________ Start time: ____________ Finish time: ____________ Date: ____________ Movements: ____________ Start time: ____________ Finish time: ____________ Date: ____________ Movements: ____________ Start time: ____________ Finish time:  ____________ Date: ____________ Movements: ____________ Start time: ____________ Finish time: ____________ Date: ____________ Movements: ____________ Start time: ____________ Finish time: ____________ Date: ____________ Movements: ____________ Start time: ____________ Finish time: ____________  Date: ____________ Movements: ____________ Start time: ____________ Finish time: ____________ Date: ____________ Movements: ____________ Start time: ____________ Finish time: ____________ Date: ____________ Movements: ____________ Start time: ____________ Finish time: ____________ Date: ____________ Movements: ____________ Start time: ____________ Finish time: ____________ Date: ____________ Movements: ____________ Start time: ____________ Finish time: ____________ Date: ____________ Movements: ____________ Start time: ____________ Finish time: ____________ Date: ____________ Movements: ____________ Start time: ____________ Finish time: ____________  Date: ____________ Movements: ____________ Start time: ____________ Finish time: ____________ Date: ____________ Movements: ____________ Start time: ____________ Finish time: ____________ Date: ____________ Movements: ____________ Start time: ____________ Finish time: ____________ Date: ____________ Movements: ____________ Start time: ____________ Finish time: ____________ Date: ____________ Movements: ____________ Start time: ____________ Finish time: ____________ Date: ____________ Movements: ____________ Start time: ____________ Finish time: ____________ Date: ____________ Movements: ____________ Start time: ____________ Finish time: ____________  Date: ____________ Movements: ____________ Start time: ____________ Finish time: ____________ Date: ____________ Movements: ____________ Start time: ____________ Finish time: ____________ Date: ____________ Movements: ____________ Start time: ____________ Finish time: ____________ Date: ____________ Movements:  ____________ Start time: ____________ Finish time: ____________ Date: ____________ Movements: ____________ Start time: ____________ Finish time: ____________ Date: ____________ Movements: ____________ Start time: ____________ Finish time: ____________ Date: ____________ Movements: ____________ Start time: ____________ Finish time: ____________  Date: ____________ Movements: ____________ Start time: ____________ Finish time: ____________ Date: ____________ Movements: ____________ Start time: ____________ Finish time: ____________ Date: ____________ Movements: ____________ Start time:   ____________ Finish time: ____________ Date: ____________ Movements: ____________ Start time: ____________ Finish time: ____________ Date: ____________ Movements: ____________ Start time: ____________ Finish time: ____________ Date: ____________ Movements: ____________ Start time: ____________ Finish time: ____________ Date: ____________ Movements: ____________ Start time: ____________ Finish time: ____________  Date: ____________ Movements: ____________ Start time: ____________ Finish time: ____________ Date: ____________ Movements: ____________ Start time: ____________ Finish time: ____________ Date: ____________ Movements: ____________ Start time: ____________ Finish time: ____________ Date: ____________ Movements: ____________ Start time: ____________ Finish time: ____________ Date: ____________ Movements: ____________ Start time: ____________ Finish time: ____________ Date: ____________ Movements: ____________ Start time: ____________ Finish time: ____________ Date: ____________ Movements: ____________ Start time: ____________ Finish time: ____________  Date: ____________ Movements: ____________ Start time: ____________ Finish time: ____________ Date: ____________ Movements: ____________ Start time: ____________ Finish time: ____________ Date: ____________ Movements: ____________ Start time: ____________ Finish  time: ____________ Date: ____________ Movements: ____________ Start time: ____________ Finish time: ____________ Date: ____________ Movements: ____________ Start time: ____________ Finish time: ____________ Date: ____________ Movements: ____________ Start time: ____________ Finish time: ____________ Date: ____________ Movements: ____________ Start time: ____________ Finish time: ____________  Date: ____________ Movements: ____________ Start time: ____________ Finish time: ____________ Date: ____________ Movements: ____________ Start time: ____________ Finish time: ____________ Date: ____________ Movements: ____________ Start time: ____________ Finish time: ____________ Date: ____________ Movements: ____________ Start time: ____________ Finish time: ____________ Date: ____________ Movements: ____________ Start time: ____________ Finish time: ____________ Date: ____________ Movements: ____________ Start time: ____________ Finish time: ____________ Document Released: 10/10/2006 Document Revised: 08/30/2011 Document Reviewed: 04/12/2009 [employer] Patient Information 2012 [employer]. 

## 2012-05-20 ENCOUNTER — Encounter: Payer: 59 | Admitting: Obstetrics and Gynecology

## 2012-05-20 ENCOUNTER — Ambulatory Visit (INDEPENDENT_AMBULATORY_CARE_PROVIDER_SITE_OTHER): Payer: 59 | Admitting: Obstetrics and Gynecology

## 2012-05-20 ENCOUNTER — Other Ambulatory Visit: Payer: Self-pay | Admitting: Obstetrics and Gynecology

## 2012-05-20 ENCOUNTER — Encounter: Payer: Self-pay | Admitting: Obstetrics and Gynecology

## 2012-05-20 ENCOUNTER — Ambulatory Visit (INDEPENDENT_AMBULATORY_CARE_PROVIDER_SITE_OTHER): Payer: 59

## 2012-05-20 VITALS — BP 118/60 | Wt 186.0 lb

## 2012-05-20 DIAGNOSIS — Z331 Pregnant state, incidental: Secondary | ICD-10-CM | POA: Insufficient documentation

## 2012-05-20 DIAGNOSIS — Z3480 Encounter for supervision of other normal pregnancy, unspecified trimester: Secondary | ICD-10-CM

## 2012-05-20 DIAGNOSIS — O09899 Supervision of other high risk pregnancies, unspecified trimester: Secondary | ICD-10-CM

## 2012-05-20 HISTORY — DX: Pregnant state, incidental: Z33.1

## 2012-05-20 LAB — US OB FOLLOW UP

## 2012-05-20 NOTE — Progress Notes (Signed)
Patient ID: Cheryl Sanders, female   DOB: May 23, 1976, 36 y.o.   MRN: [id number] Plans VBAC [place] reviewed Reviewed s/s uc, srom, vag bleeding, daily fetal kick counts to report, steady abd pain to report, comfort measures. Encouragged 8 water daily and frequent voids. Lavera Guise, CNM

## 2012-05-20 NOTE — Progress Notes (Signed)
Pt wants cx check today Hx LGA u/s AFI 60th Anterior placenta   EFW 8 lbs 12 oz

## 2012-05-27 ENCOUNTER — Encounter: Payer: Self-pay | Admitting: Obstetrics and Gynecology

## 2012-05-27 ENCOUNTER — Ambulatory Visit (INDEPENDENT_AMBULATORY_CARE_PROVIDER_SITE_OTHER): Payer: 59 | Admitting: Obstetrics and Gynecology

## 2012-05-27 VITALS — BP 132/70 | Wt 186.0 lb

## 2012-05-27 DIAGNOSIS — Z331 Pregnant state, incidental: Secondary | ICD-10-CM

## 2012-05-27 NOTE — Progress Notes (Signed)
[redacted]w[redacted]d Request cervix check.

## 2012-05-27 NOTE — Progress Notes (Signed)
[redacted]w[redacted]d BH. No change in discharge LGA with EFW at 8 lbs 12 oz at 36 weeks. Still desires VBAC.

## 2012-05-31 ENCOUNTER — Encounter (HOSPITAL_COMMUNITY): Payer: Self-pay

## 2012-05-31 ENCOUNTER — Encounter (HOSPITAL_COMMUNITY)
Admission: AD | Disposition: A | Discharge status: Home / Self Care | Payer: Self-pay | Source: Ambulatory Visit | Attending: Obstetrics and Gynecology

## 2012-05-31 ENCOUNTER — Inpatient Hospital Stay (HOSPITAL_COMMUNITY): Payer: 59 | Admitting: Anesthesiology

## 2012-05-31 ENCOUNTER — Inpatient Hospital Stay (HOSPITAL_COMMUNITY)
Admission: AD | Admit: 2012-05-31 | Discharge: 2012-06-03 | DRG: 766 | Disposition: A | Discharge status: Home / Self Care | Payer: 59 | Source: Ambulatory Visit | Attending: Obstetrics and Gynecology | Admitting: Obstetrics and Gynecology

## 2012-05-31 ENCOUNTER — Encounter (HOSPITAL_COMMUNITY): Payer: Self-pay | Admitting: Anesthesiology

## 2012-05-31 DIAGNOSIS — O09529 Supervision of elderly multigravida, unspecified trimester: Secondary | ICD-10-CM | POA: Diagnosis present

## 2012-05-31 DIAGNOSIS — Z302 Encounter for sterilization: Secondary | ICD-10-CM

## 2012-05-31 DIAGNOSIS — O26879 Cervical shortening, unspecified trimester: Secondary | ICD-10-CM

## 2012-05-31 DIAGNOSIS — O34219 Maternal care for unspecified type scar from previous cesarean delivery: Secondary | ICD-10-CM | POA: Diagnosis not present

## 2012-05-31 DIAGNOSIS — N1330 Unspecified hydronephrosis: Secondary | ICD-10-CM | POA: Diagnosis not present

## 2012-05-31 DIAGNOSIS — O3660X0 Maternal care for excessive fetal growth, unspecified trimester, not applicable or unspecified: Secondary | ICD-10-CM | POA: Diagnosis present

## 2012-05-31 DIAGNOSIS — Z98891 History of uterine scar from previous surgery: Secondary | ICD-10-CM

## 2012-05-31 LAB — RPR: RPR Ser Ql: NONREACTIVE

## 2012-05-31 LAB — CBC
MCHC: 32.9 g/dL (ref 30.0–36.0)
Platelets: 250 10*3/uL (ref 150–400)
RDW: 14.1 % (ref 11.5–15.5)

## 2012-05-31 LAB — TYPE AND SCREEN: ABO/RH(D): O POS

## 2012-05-31 SURGERY — Surgical Case
Anesthesia: Epidural | Site: Abdomen | Wound class: Clean Contaminated

## 2012-05-31 MED ORDER — KETOROLAC TROMETHAMINE 30 MG/ML IJ SOLN: 30.0000 mg | Freq: Four times a day (QID) | INTRAMUSCULAR | Status: AC | PRN

## 2012-05-31 MED ORDER — DIPHENHYDRAMINE HCL 25 MG PO CAPS: 25.0000 mg | ORAL_CAPSULE | ORAL | Status: DC | PRN

## 2012-05-31 MED ORDER — LACTATED RINGERS IV SOLN
INTRAVENOUS | Status: DC
  Administered 2012-05-31 (×2): via INTRAVENOUS

## 2012-05-31 MED ORDER — AMPICILLIN SODIUM 2 G IJ SOLR
2.0000 g | Freq: Once | INTRAMUSCULAR | Status: DC
  Filled 2012-05-31: qty 2000

## 2012-05-31 MED ORDER — EPHEDRINE 5 MG/ML INJ
10.0000 mg | INTRAVENOUS | Status: DC | PRN
  Filled 2012-05-31: qty 4

## 2012-05-31 MED ORDER — CEFAZOLIN SODIUM 1-5 GM-% IV SOLN
INTRAVENOUS | Status: DC | PRN
  Administered 2012-05-31: 2 g via INTRAVENOUS

## 2012-05-31 MED ORDER — LIDOCAINE HCL (PF) 1 % IJ SOLN
30.0000 mL | INTRAMUSCULAR | Status: DC | PRN
  Filled 2012-05-31: qty 30

## 2012-05-31 MED ORDER — MORPHINE SULFATE 0.5 MG/ML IJ SOLN
INTRAMUSCULAR | Status: AC
  Filled 2012-05-31: qty 10

## 2012-05-31 MED ORDER — OXYCODONE-ACETAMINOPHEN 5-325 MG PO TABS
1.0000 | ORAL_TABLET | ORAL | Status: DC | PRN
  Administered 2012-06-02 – 2012-06-03 (×5): 1 via ORAL
  Filled 2012-05-31 (×5): qty 1

## 2012-05-31 MED ORDER — OXYTOCIN 40 UNITS IN LACTATED RINGERS INFUSION - SIMPLE MED: 62.5000 mL/h | INTRAVENOUS | Status: AC

## 2012-05-31 MED ORDER — MEPERIDINE HCL 25 MG/ML IJ SOLN: 6.2500 mg | INTRAMUSCULAR | Status: DC | PRN

## 2012-05-31 MED ORDER — DIPHENHYDRAMINE HCL 25 MG PO CAPS: 25.0000 mg | ORAL_CAPSULE | Freq: Four times a day (QID) | ORAL | Status: DC | PRN

## 2012-05-31 MED ORDER — METOCLOPRAMIDE HCL 5 MG/ML IJ SOLN: 10.0000 mg | Freq: Three times a day (TID) | INTRAMUSCULAR | Status: DC | PRN

## 2012-05-31 MED ORDER — WITCH HAZEL-GLYCERIN EX PADS: 1.0000 "application " | MEDICATED_PAD | CUTANEOUS | Status: DC | PRN

## 2012-05-31 MED ORDER — SODIUM CHLORIDE 0.9 % IJ SOLN: 3.0000 mL | INTRAMUSCULAR | Status: DC | PRN

## 2012-05-31 MED ORDER — ONDANSETRON HCL 4 MG/2ML IJ SOLN
INTRAMUSCULAR | Status: DC | PRN
  Administered 2012-05-31: 4 mg via INTRAVENOUS

## 2012-05-31 MED ORDER — LACTATED RINGERS IV SOLN
INTRAVENOUS | Status: DC | PRN
  Administered 2012-05-31 (×2): via INTRAVENOUS

## 2012-05-31 MED ORDER — KETOROLAC TROMETHAMINE 60 MG/2ML IM SOLN
INTRAMUSCULAR | Status: AC
  Administered 2012-05-31: 60 mg via INTRAMUSCULAR
  Filled 2012-05-31: qty 2

## 2012-05-31 MED ORDER — TERBUTALINE SULFATE 1 MG/ML IJ SOLN: 0.2500 mg | Freq: Once | INTRAMUSCULAR | Status: DC | PRN

## 2012-05-31 MED ORDER — EPHEDRINE 5 MG/ML INJ: 10.0000 mg | INTRAVENOUS | Status: DC | PRN

## 2012-05-31 MED ORDER — MENTHOL 3 MG MT LOZG: 1.0000 | LOZENGE | OROMUCOSAL | Status: DC | PRN

## 2012-05-31 MED ORDER — TETANUS-DIPHTH-ACELL PERTUSSIS 5-2.5-18.5 LF-MCG/0.5 IM SUSP: 0.5000 mL | Freq: Once | INTRAMUSCULAR | Status: DC

## 2012-05-31 MED ORDER — IBUPROFEN 600 MG PO TABS: 600.0000 mg | ORAL_TABLET | Freq: Four times a day (QID) | ORAL | Status: DC | PRN

## 2012-05-31 MED ORDER — DIPHENHYDRAMINE HCL 50 MG/ML IJ SOLN: 12.5000 mg | INTRAMUSCULAR | Status: DC | PRN

## 2012-05-31 MED ORDER — OXYCODONE-ACETAMINOPHEN 5-325 MG PO TABS: 1.0000 | ORAL_TABLET | ORAL | Status: DC | PRN

## 2012-05-31 MED ORDER — ZOLPIDEM TARTRATE 5 MG PO TABS: 5.0000 mg | ORAL_TABLET | Freq: Every evening | ORAL | Status: DC | PRN

## 2012-05-31 MED ORDER — PRENATAL MULTIVITAMIN CH
1.0000 | ORAL_TABLET | Freq: Every day | ORAL | Status: DC
  Administered 2012-06-01 – 2012-06-03 (×3): 1 via ORAL
  Filled 2012-05-31 (×3): qty 1

## 2012-05-31 MED ORDER — PHENYLEPHRINE 40 MCG/ML (10ML) SYRINGE FOR IV PUSH (FOR BLOOD PRESSURE SUPPORT)
PREFILLED_SYRINGE | INTRAVENOUS | Status: AC
  Filled 2012-05-31: qty 10

## 2012-05-31 MED ORDER — NALBUPHINE HCL 10 MG/ML IJ SOLN
5.0000 mg | INTRAMUSCULAR | Status: DC | PRN
  Filled 2012-05-31: qty 1

## 2012-05-31 MED ORDER — FENTANYL CITRATE 0.05 MG/ML IJ SOLN: 25.0000 ug | INTRAMUSCULAR | Status: DC | PRN

## 2012-05-31 MED ORDER — PHENYLEPHRINE 40 MCG/ML (10ML) SYRINGE FOR IV PUSH (FOR BLOOD PRESSURE SUPPORT)
PREFILLED_SYRINGE | INTRAVENOUS | Status: AC
  Filled 2012-05-31: qty 5

## 2012-05-31 MED ORDER — SIMETHICONE 80 MG PO CHEW: 80.0000 mg | CHEWABLE_TABLET | ORAL | Status: DC | PRN

## 2012-05-31 MED ORDER — EPHEDRINE SULFATE 50 MG/ML IJ SOLN
INTRAMUSCULAR | Status: DC | PRN
  Administered 2012-05-31: 10 mg via INTRAVENOUS

## 2012-05-31 MED ORDER — EPHEDRINE 5 MG/ML INJ
INTRAVENOUS | Status: AC
  Filled 2012-05-31: qty 10

## 2012-05-31 MED ORDER — MORPHINE SULFATE (PF) 0.5 MG/ML IJ SOLN
INTRAMUSCULAR | Status: DC | PRN
  Administered 2012-05-31: 4 mg via EPIDURAL

## 2012-05-31 MED ORDER — OXYTOCIN 10 UNIT/ML IJ SOLN
INTRAMUSCULAR | Status: AC
  Filled 2012-05-31: qty 4

## 2012-05-31 MED ORDER — LACTATED RINGERS IV BOLUS (SEPSIS)
1000.0000 mL | Freq: Once | INTRAVENOUS | Status: AC
  Administered 2012-05-31: 1000 mL via INTRAVENOUS

## 2012-05-31 MED ORDER — NALBUPHINE HCL 10 MG/ML IJ SOLN
10.0000 mg | Freq: Once | INTRAMUSCULAR | Status: AC
  Administered 2012-05-31: 10 mg via INTRAVENOUS
  Filled 2012-05-31 (×2): qty 1

## 2012-05-31 MED ORDER — LANOLIN HYDROUS EX OINT: 1.0000 "application " | TOPICAL_OINTMENT | CUTANEOUS | Status: DC | PRN

## 2012-05-31 MED ORDER — LACTATED RINGERS IV SOLN
500.0000 mL | INTRAVENOUS | Status: DC | PRN
  Administered 2012-05-31: 500 mL via INTRAVENOUS

## 2012-05-31 MED ORDER — MORPHINE SULFATE (PF) 0.5 MG/ML IJ SOLN
INTRAMUSCULAR | Status: DC | PRN
  Administered 2012-05-31: 1 mg via INTRAVENOUS

## 2012-05-31 MED ORDER — FENTANYL CITRATE 0.05 MG/ML IJ SOLN
INTRAMUSCULAR | Status: DC | PRN
  Administered 2012-05-31: 100 ug via INTRAVENOUS

## 2012-05-31 MED ORDER — SCOPOLAMINE 1 MG/3DAYS TD PT72
1.0000 | MEDICATED_PATCH | Freq: Once | TRANSDERMAL | Status: DC
  Administered 2012-05-31: 1.5 mg via TRANSDERMAL

## 2012-05-31 MED ORDER — SIMETHICONE 80 MG PO CHEW
80.0000 mg | CHEWABLE_TABLET | Freq: Three times a day (TID) | ORAL | Status: DC
  Administered 2012-06-01 – 2012-06-03 (×7): 80 mg via ORAL

## 2012-05-31 MED ORDER — OXYTOCIN 40 UNITS IN LACTATED RINGERS INFUSION - SIMPLE MED: 62.5000 mL/h | Freq: Once | INTRAVENOUS | Status: DC

## 2012-05-31 MED ORDER — PROMETHAZINE HCL 25 MG/ML IJ SOLN
25.0000 mg | Freq: Once | INTRAMUSCULAR | Status: AC
  Administered 2012-05-31: 25 mg via INTRAVENOUS
  Filled 2012-05-31: qty 1

## 2012-05-31 MED ORDER — ONDANSETRON HCL 4 MG/2ML IJ SOLN
INTRAMUSCULAR | Status: AC
Start: 2012-05-31 — End: 2012-05-31
  Filled 2012-05-31: qty 2

## 2012-05-31 MED ORDER — ONDANSETRON HCL 4 MG/2ML IJ SOLN: 4.0000 mg | INTRAMUSCULAR | Status: DC | PRN

## 2012-05-31 MED ORDER — OXYTOCIN 40 UNITS IN LACTATED RINGERS INFUSION - SIMPLE MED
1.0000 m[IU]/min | INTRAVENOUS | Status: DC
  Administered 2012-05-31: 1 m[IU]/min via INTRAVENOUS
  Filled 2012-05-31: qty 1000

## 2012-05-31 MED ORDER — OXYTOCIN BOLUS FROM INFUSION
500.0000 mL | Freq: Once | INTRAVENOUS | Status: DC
  Filled 2012-05-31: qty 500

## 2012-05-31 MED ORDER — CEFAZOLIN SODIUM-DEXTROSE 2-3 GM-% IV SOLR
INTRAVENOUS | Status: AC
  Filled 2012-05-31: qty 50

## 2012-05-31 MED ORDER — CITRIC ACID-SODIUM CITRATE 334-500 MG/5ML PO SOLN
30.0000 mL | ORAL | Status: DC | PRN
  Administered 2012-05-31: 30 mL via ORAL
  Filled 2012-05-31: qty 15

## 2012-05-31 MED ORDER — OXYTOCIN 10 UNIT/ML IJ SOLN
40.0000 [IU] | INTRAVENOUS | Status: DC | PRN
  Administered 2012-05-31: 40 [IU] via INTRAVENOUS

## 2012-05-31 MED ORDER — SCOPOLAMINE 1 MG/3DAYS TD PT72
MEDICATED_PATCH | TRANSDERMAL | Status: AC
  Administered 2012-05-31: 1.5 mg via TRANSDERMAL
  Filled 2012-05-31: qty 1

## 2012-05-31 MED ORDER — 0.9 % SODIUM CHLORIDE (POUR BTL) OPTIME
TOPICAL | Status: DC | PRN
  Administered 2012-05-31: 500 mL

## 2012-05-31 MED ORDER — NALOXONE HCL 0.4 MG/ML IJ SOLN: 0.4000 mg | INTRAMUSCULAR | Status: DC | PRN

## 2012-05-31 MED ORDER — ONDANSETRON HCL 4 MG/2ML IJ SOLN: 4.0000 mg | Freq: Three times a day (TID) | INTRAMUSCULAR | Status: DC | PRN

## 2012-05-31 MED ORDER — SENNOSIDES-DOCUSATE SODIUM 8.6-50 MG PO TABS
2.0000 | ORAL_TABLET | Freq: Every day | ORAL | Status: DC
  Administered 2012-06-01 – 2012-06-02 (×3): 2 via ORAL

## 2012-05-31 MED ORDER — ACETAMINOPHEN 325 MG PO TABS
650.0000 mg | ORAL_TABLET | ORAL | Status: DC | PRN
  Administered 2012-05-31: 650 mg via ORAL
  Filled 2012-05-31: qty 2

## 2012-05-31 MED ORDER — LACTATED RINGERS IV SOLN
500.0000 mL | Freq: Once | INTRAVENOUS | Status: AC
  Administered 2012-05-31: 500 mL via INTRAVENOUS

## 2012-05-31 MED ORDER — ONDANSETRON HCL 4 MG PO TABS: 4.0000 mg | ORAL_TABLET | ORAL | Status: DC | PRN

## 2012-05-31 MED ORDER — DIBUCAINE 1 % RE OINT: 1.0000 "application " | TOPICAL_OINTMENT | RECTAL | Status: DC | PRN

## 2012-05-31 MED ORDER — KETOROLAC TROMETHAMINE 60 MG/2ML IM SOLN
60.0000 mg | Freq: Once | INTRAMUSCULAR | Status: AC | PRN
  Administered 2012-05-31: 60 mg via INTRAMUSCULAR

## 2012-05-31 MED ORDER — ONDANSETRON HCL 4 MG/2ML IJ SOLN
4.0000 mg | Freq: Four times a day (QID) | INTRAMUSCULAR | Status: DC | PRN
  Administered 2012-05-31: 4 mg via INTRAVENOUS
  Filled 2012-05-31: qty 2

## 2012-05-31 MED ORDER — SODIUM CHLORIDE 0.9 % IV SOLN
1.0000 ug/kg/h | INTRAVENOUS | Status: DC | PRN
  Filled 2012-05-31: qty 2.5

## 2012-05-31 MED ORDER — IBUPROFEN 600 MG PO TABS
600.0000 mg | ORAL_TABLET | Freq: Four times a day (QID) | ORAL | Status: DC
  Administered 2012-06-01 – 2012-06-03 (×6): 600 mg via ORAL
  Filled 2012-05-31 (×2): qty 1

## 2012-05-31 MED ORDER — IBUPROFEN 600 MG PO TABS
600.0000 mg | ORAL_TABLET | Freq: Four times a day (QID) | ORAL | Status: DC | PRN
  Administered 2012-06-01: 600 mg via ORAL
  Filled 2012-05-31 (×4): qty 1

## 2012-05-31 MED ORDER — SODIUM BICARBONATE 8.4 % IV SOLN
INTRAVENOUS | Status: DC | PRN
  Administered 2012-05-31: 5 mL via EPIDURAL

## 2012-05-31 MED ORDER — DIPHENHYDRAMINE HCL 50 MG/ML IJ SOLN: 25.0000 mg | INTRAMUSCULAR | Status: DC | PRN

## 2012-05-31 MED ORDER — PHENYLEPHRINE HCL 10 MG/ML IJ SOLN
INTRAMUSCULAR | Status: DC | PRN
  Administered 2012-05-31: 80 ug via INTRAVENOUS
  Administered 2012-05-31 (×2): 120 ug via INTRAVENOUS
  Administered 2012-05-31 (×2): 80 ug via INTRAVENOUS
  Administered 2012-05-31 (×2): 120 ug via INTRAVENOUS

## 2012-05-31 MED ORDER — LACTATED RINGERS IV SOLN
INTRAVENOUS | Status: DC
  Administered 2012-06-01 (×3): via INTRAVENOUS

## 2012-05-31 MED ORDER — PHENYLEPHRINE 40 MCG/ML (10ML) SYRINGE FOR IV PUSH (FOR BLOOD PRESSURE SUPPORT)
80.0000 ug | PREFILLED_SYRINGE | INTRAVENOUS | Status: DC | PRN
  Filled 2012-05-31: qty 5

## 2012-05-31 MED ORDER — PHENYLEPHRINE 40 MCG/ML (10ML) SYRINGE FOR IV PUSH (FOR BLOOD PRESSURE SUPPORT): 80.0000 ug | PREFILLED_SYRINGE | INTRAVENOUS | Status: DC | PRN

## 2012-05-31 MED ORDER — LIDOCAINE HCL (PF) 1 % IJ SOLN
INTRAMUSCULAR | Status: DC | PRN
  Administered 2012-05-31 (×3): 4 mL

## 2012-05-31 MED ORDER — CEFAZOLIN SODIUM-DEXTROSE 2-3 GM-% IV SOLR
2.0000 g | Freq: Once | INTRAVENOUS | Status: DC
  Filled 2012-05-31: qty 50

## 2012-05-31 MED ORDER — OXYTOCIN 10 UNIT/ML IJ SOLN: 10.0000 [IU] | Freq: Once | INTRAMUSCULAR | Status: DC

## 2012-05-31 MED ORDER — FENTANYL CITRATE 0.05 MG/ML IJ SOLN
INTRAMUSCULAR | Status: AC
  Filled 2012-05-31: qty 2

## 2012-05-31 MED ORDER — FENTANYL 2.5 MCG/ML BUPIVACAINE 1/10 % EPIDURAL INFUSION (WH - ANES)
14.0000 mL/h | INTRAMUSCULAR | Status: DC
  Administered 2012-05-31 (×3): 14 mL/h via EPIDURAL
  Filled 2012-05-31 (×3): qty 60

## 2012-05-31 SURGICAL SUPPLY — 38 items
BENZOIN TINCTURE PRP APPL 2/3 (GAUZE/BANDAGES/DRESSINGS) ×2 IMPLANT
CHLORAPREP W/TINT 26ML (MISCELLANEOUS) ×2 IMPLANT
CLOTH BEACON ORANGE TIMEOUT ST (SAFETY) ×2 IMPLANT
CONTAINER PREFILL 10% NBF 15ML (MISCELLANEOUS) IMPLANT
DRESSING TELFA 8X3 (GAUZE/BANDAGES/DRESSINGS) ×2 IMPLANT
DRSG COVADERM 4X10 (GAUZE/BANDAGES/DRESSINGS) ×2 IMPLANT
ELECT REM PT RETURN 9FT ADLT (ELECTROSURGICAL) ×2
ELECTRODE REM PT RTRN 9FT ADLT (ELECTROSURGICAL) ×1 IMPLANT
EXTRACTOR VACUUM M CUP 4 TUBE (SUCTIONS) IMPLANT
GAUZE SPONGE 4X4 12PLY STRL LF (GAUZE/BANDAGES/DRESSINGS) ×2 IMPLANT
GLOVE BIO SURGEON STRL SZ7.5 (GLOVE) ×4 IMPLANT
GLOVE BIOGEL PI IND STRL 7.5 (GLOVE) ×1 IMPLANT
GLOVE BIOGEL PI INDICATOR 7.5 (GLOVE) ×1
GOWN PREVENTION PLUS LG XLONG (DISPOSABLE) ×6 IMPLANT
KIT ABG SYR 3ML LUER SLIP (SYRINGE) IMPLANT
NEEDLE HYPO 22GX1.5 SAFETY (NEEDLE) IMPLANT
NEEDLE HYPO 25X5/8 SAFETYGLIDE (NEEDLE) IMPLANT
NS IRRIG 1000ML POUR BTL (IV SOLUTION) ×2 IMPLANT
PACK C SECTION WH (CUSTOM PROCEDURE TRAY) ×2 IMPLANT
PAD ABD 7.5X8 STRL (GAUZE/BANDAGES/DRESSINGS) ×2 IMPLANT
PAD OB MATERNITY 4.3X12.25 (PERSONAL CARE ITEMS) ×2 IMPLANT
RETRACTOR WND ALEXIS 25 LRG (MISCELLANEOUS) ×1 IMPLANT
RTRCTR WOUND ALEXIS 25CM LRG (MISCELLANEOUS) ×2
SLEEVE SCD COMPRESS KNEE MED (MISCELLANEOUS) ×2 IMPLANT
STRIP CLOSURE SKIN 1/2X4 (GAUZE/BANDAGES/DRESSINGS) ×2 IMPLANT
SUT CHROMIC 2 0 CT 1 (SUTURE) ×2 IMPLANT
SUT MNCRL AB 3-0 PS2 27 (SUTURE) ×2 IMPLANT
SUT PLAIN 0 NONE (SUTURE) IMPLANT
SUT PLAIN 2 0 XLH (SUTURE) ×2 IMPLANT
SUT VIC AB 0 CT1 36 (SUTURE) ×6 IMPLANT
SUT VIC AB 0 CTX 36 (SUTURE) ×4
SUT VIC AB 0 CTX36XBRD ANBCTRL (SUTURE) ×4 IMPLANT
SYR BULB IRRIGATION 50ML (SYRINGE) ×4 IMPLANT
SYR CONTROL 10ML LL (SYRINGE) IMPLANT
TAPE CLOTH SURG 4X10 WHT LF (GAUZE/BANDAGES/DRESSINGS) ×2 IMPLANT
TOWEL OR 17X24 6PK STRL BLUE (TOWEL DISPOSABLE) ×4 IMPLANT
TRAY FOLEY CATH 14FR (SET/KITS/TRAYS/PACK) ×2 IMPLANT
WATER STERILE IRR 1000ML POUR (IV SOLUTION) ×2 IMPLANT

## 2012-05-31 NOTE — Progress Notes (Signed)
Cheryl Sanders is a 36 y.o. G3P1011 at [redacted]w[redacted]d admitted for active labor  Subjective: Pt reports she is now comfortable with epidural.  States she desires attempt at [facility] at the present time.    Objective: BP 104/60  Pulse 91  Temp 97.1 F (36.2 C) (Oral)  Resp 18  Ht 5\' 3"  (1.6 m)  Wt 83.915 kg (185 lb)  BMI 32.77 kg/m2  SpO2 98%  LMP 08/28/2011     FHT:  FHR: 150 bpm, variability: moderate,  accelerations:  Present,  decelerations:  Absent UC:   regular, every 2-8 minutes SVE:   Dilation: 5 Effacement (%): 100 Station: -1 Exam by:: n. Morrison Masser, cnm AROM performed and IUPC inserted without difficulty after RBA d/w pt.  Sm amt blood tinged fluid returned.    Labs: Lab Results  Component Value Date   WBC 11.3* 05/31/2012   HGB 12.0 05/31/2012   HCT 36.5 05/31/2012   MCV 84.5 05/31/2012   PLT 250 05/31/2012    Assessment / Plan: Spontaneous labor with little progress  Labor: Prolonged active phase of labor. Preeclampsia:  no signs or symptoms of toxicity Fetal Wellbeing:  Category I Pain Control:  Epidural I/D:  n/a Anticipated MOD:  Desires VBAC attempt  Pitocin augmentation per consult with Dr. Su Hilt.   D/W pt and husband RBA of pitocin augmentation of labor and agrees to proceed.  Will begin Pitocin at 73mu/min and increase by 70mu/min.   D/W pt and husband again RBA of VBAC.       Joseluis Alessio O. 05/31/2012, 9:52 AM

## 2012-05-31 NOTE — Progress Notes (Signed)
Cheryl Sanders is a 36 y.o. G3P1011 at [redacted]w[redacted]d admitted for active labor  Subjective: Pt remains comfortable at present and denies any vaginal or rectal pressure.    Objective: BP 130/112  Pulse 99  Temp 99.1 F (37.3 C) (Axillary)  Resp 20  Ht 5\' 3"  (1.6 m)  Wt 185 lb (83.915 kg)  BMI 32.77 kg/m2  SpO2 100%  LMP 08/28/2011    FHT:  Baseline 190 bpm with minimal to moderate variability.  Variable decel to nadir of 120's and occas early decel present.   UC:   regular, every 2-4 minutes. Pitocin at 102mu/min SVE:   Dilation: 10 Effacement (%): 100 Station: +1;+2 Exam by:: hk Pushes effectively with UCs.  Vtx to +2 station with pushing then recedes to +1 without pushing.    Labs: Lab Results  Component Value Date   WBC 11.3* 05/31/2012   HGB 12.0 05/31/2012   HCT 36.5 05/31/2012   MCV 84.5 05/31/2012   PLT 250 05/31/2012    Assessment / Plan: IUP at 39w 4d Fetal tachycardia Occiput transverse  Labor: Progressing normally Preeclampsia:  no signs or symptoms of toxicity Fetal Wellbeing:  Category II Pain Control:  Epidural I/D:  n/a Anticipated MOD:  LTCS  Consult obtained with Dr. Su Hilt.  RBA repeat LTCS d/w pt due to fetal tachycardia and fetal presentation.  Agrees to proceed.  Pt also desires bilateral tubal ligation and RBA d/w pt and desires to proceed at time of C/S.   Routine preop orders. OR notified by Dr. Su Hilt.   Mikka Kissner O. 05/31/2012, 6:38 PM

## 2012-05-31 NOTE — Anesthesia Procedure Notes (Signed)
Epidural Patient location during procedure: OB Start time: 05/31/2012 8:37 AM  Staffing Performed by: anesthesiologist   Preanesthetic Checklist Completed: patient identified, site marked, surgical consent, pre-op evaluation, timeout performed, IV checked, risks and benefits discussed and monitors and equipment checked  Epidural Patient position: sitting Prep: site prepped and draped and DuraPrep Patient monitoring: continuous pulse ox and blood pressure Approach: midline Injection technique: LOR air  Needle:  Needle type: Tuohy  Needle gauge: 17 G Needle length: 9 cm and 9 Needle insertion depth: 4 cm Catheter type: closed end flexible Catheter size: 19 Gauge Catheter at skin depth: 9 cm Test dose: negative  Assessment Events: blood not aspirated, injection not painful, no injection resistance, negative IV test and no paresthesia  Additional Notes Discussed risk of headache, infection, bleeding, nerve injury and failed or incomplete block.  Patient voices understanding and wishes to proceed. Reason for block:procedure for pain

## 2012-05-31 NOTE — Progress Notes (Addendum)
Cheryl Sanders is a 36 y.o. G3P1011 at [redacted]w[redacted]d admitted for active labor  Subjective: Remains comfortable at present.  Had increased rectal pressure earlier which resolved.  Last SVE by RN at 1616 C/C/+1.  No other complaints at present.   Objective: BP 132/48  Pulse 81  Temp 98 F (36.7 C) (Oral)  Resp 18  Ht 5\' 3"  (1.6 m)  Wt 185 lb (83.915 kg)  BMI 32.77 kg/m2  SpO2 98%  LMP 08/28/2011     FHT:  FHR: 165 bpm, variability: moderate,  accelerations:  Present,  decelerations:  Present Non-repetitive variable decels and occas early decel noted. Intermittent periods of tachycardia resolved with position chgs, O2, and IVF bolus. UC:   regular, every 1.5-3 minutes. Pitocin at 4mu.  MVUs 175-180.   SVE:  C/C/+2. Labs: Lab Results  Component Value Date   WBC 11.3* 05/31/2012   HGB 12.0 05/31/2012   HCT 36.5 05/31/2012   MCV 84.5 05/31/2012   PLT 250 05/31/2012    Assessment / Plan: Augmentation of labor, progressing well  Labor: Augmentation of labor with normal progress Preeclampsia:  no signs or symptoms of toxicity Fetal Wellbeing:  Category II Pain Control:  Labor support without medications and Epidural I/D:  n/a Anticipated MOD:  Attempt VBAC  D/W pt continuing to labor down vs beginning to push.  Pt desires to begin pushing with UCs.  Will continue to observe FHR carefully.    Cheryl Sanders O. 05/31/2012, 2:26 PM

## 2012-05-31 NOTE — Anesthesia Postprocedure Evaluation (Signed)
Anesthesia Post Note  Patient: [place] Hedman NEAVE-Johnson  Procedure(s) Performed: Procedure(s) (LRB): CESAREAN SECTION (N/A)  Anesthesia type: Epidural  Patient location: PACU  Post pain: Pain level controlled  Post assessment: Post-op Vital signs reviewed  Last Vitals:  Filed Vitals:   05/31/12 2100  BP: 115/65  Pulse: 80  Temp:   Resp: 12    Post vital signs: stable  Level of consciousness: awake  Complications: No apparent anesthesia complications.  O2 sat is 93% on room air, dips as low as 88% with supine position.  Up to 97% with cough and deep breathing.  Was in low 90% on arrival to OR on room air just before surgery.  Did receive large IV fluid boluses in OR due to hypotension and concentrated urine, but minimal crackles are heard now.  Will send to floor on pulse ox (received duramorph), plan to provide [place] O2 if sats drop below 92%.  Jasmine December, MD

## 2012-05-31 NOTE — Transfer of Care (Signed)
Immediate Anesthesia Transfer of Care Note  Patient: Cheryl Sanders  Procedure(s) Performed: Procedure(s) (LRB) with comments: CESAREAN SECTION (N/A) - with Bilateral tubal ligation  Patient Location: PACU  Anesthesia Type: Epidural  Level of Consciousness: awake, alert  and oriented  Airway & Oxygen Therapy: Patient Spontanous Breathing  Post-op Assessment: Report given to PACU RN and Post -op Vital signs reviewed and stable  Post vital signs: stable  Complications: No apparent anesthesia complications

## 2012-05-31 NOTE — Op Note (Signed)
Cesarean Section Procedure Note  Indications: 1.39 4/7wks 2.NRFHT 3.Fetal Tachycardia  Pre-operative Diagnosis: 1.39 4/7wks 2.NRFHT 3.Fetal Tachycardia 4.Desires Permanent Sterilization  Post-operative Diagnosis:1.39 4/7wks 2.NRFHT 3.Fetal Tachycardia 4.Desires Permanent Sterilization   Procedure: CESAREAN SECTION AND BTL  Surgeon: Purcell Nails, MD    Assistants: Elsie Ra, CNM  Anesthesia: Regional  Anesthesiologist: Dana Allan, MD   Procedure Details  The patient was taken to the operating room after the risks, benefits, complications, treatment options, and expected outcomes were discussed with the patient.  The patient concurred with the proposed plan, giving informed consent which was signed and witnessed. The patient was taken to Operating Room 1, identified as Cheryl Sanders and the procedure verified as C-Section Delivery. A Time Out was held and the above information confirmed.  After induction of anesthesia by obtaining a surgical level via the spinal, the patient was prepped and draped in the usual sterile manner. A Pfannenstiel skin incision was made and carried down through the subcutaneous tissue to the underlying layer of fascia.  The fascia was incised bilaterally and extended transversely bilaterally with the Mayo scissors. Kocher clamps were placed on the inferior aspect of the fascial incision and the underlying rectus muscle was separated from the fascia. The same was done on the superior aspect of the fascial incision.  The peritoneum was identified, entered bluntly and extended manually. The utero-vesical peritoneal reflection was incised transversely and the bladder flap was bluntly freed from the lower uterine segment. A low transverse uterine incision was made with the scalpel and extended bilaterally with the bandage scissors.  The infant was delivered in vertex position without difficulty however the fetal head was quite low and in LOT position.  After  the umbilical cord was clamped and cut, the infant was handed to the awaiting pediatricians.  Cord blood was obtained for evaluation.  The placenta was removed intact and appeared to be within normal limits. The uterus was cleared of all clots and debris. The uterine incision was closed with running interlocking sutures of 0 Vicryl and a second imbricating layer was performed as well.  The urine was initially concentrated and after delivery of the fetal head the urine was noted to be frankly bloody and the bladder was distended and not draining well.  The bladder was retrograde filled and flushed several times until the urine started to clear and there was no leak noted.  A blood clot was removed in the shape of the urethra.  Bilateral tubes and ovaries appeared to be within normal limits.  Good hemostasis was noted.  Copious irrigation was performed until clear.  The uterus was exteriorized and the left fallopian tube grasped in the midportion with a babcock after carrying it out to its fimbriated end and ligated with two 2-0 plain ties.  The tube was excised and the remaining pedicle cauterized with the bovie. The same was done on the contralateral side.  The peritoneum was repaired with 2-0 chromic via a running suture.  The fascia was reapproximated with a running suture of 0 Vicryl. The subcutaneous tissue was reapproximated with 3 interrupted sutures of 2-0 plain.  The skin was reapproximated with a subcuticular suture of 3-0 monocryl.  Steristrips were applied.  Instrument, sponge, and needle counts were correct prior to abdominal closure and at the conclusion of the case.  The patient was awaiting transfer to the recovery room in good condition.  Findings: Live female infant with Apgars 8 at one minute and 9 at five minutes.  Normal appearing bilateral ovaries and fallopian tubes were noted.    Estimated Blood Loss:          Drains: foley to gravity 300 bloody         Total IV Fluids:          Specimens to Pathology: Placenta and Bilateral Portion of Tubes         Complications:  None; patient tolerated the procedure well.         Disposition: PACU - hemodynamically stable.         Condition: stable  Attending Attestation: I performed the procedure.

## 2012-05-31 NOTE — MAU Note (Signed)
Patient is in for labor eval. She states that ctx are q6-32mins apart but are more intense and painful pressure in between. She denies vaginal bleeding or lof. She reports good fetal movement

## 2012-05-31 NOTE — MAU Note (Signed)
Contractions, pressure 

## 2012-05-31 NOTE — MAU Provider Note (Signed)
History     CSN: [id number]  Arrival date and time: 05/31/12 7829   First Provider Initiated Contact with Patient 05/31/12 0259      Chief Complaint  Patient presents with  . Labor Eval   HPI Comments: Pt is a 36yo G3P1 at [redacted]w[redacted]d that arrives unannounced w c/o irregular strong ctx. They started about 1am yesterday and then spaced out, but have returned this evening and are very strong. She also c/o increased pressure. She has had some bloody show, denies LOF, reports GFM  Pt has a hx of C/S for FTP and she desires VBAC.      Past Medical History  Diagnosis Date  . Advanced maternal age in pregnancy     3  . Urinary tract infection     1st tri  . H/O: C-section     2010  . Smoking history   . Anxiety   . Mental disorder   . GBS carrier   . H/O varicella     Past Surgical History  Procedure Date  . Cesarean section   . Dilation and curettage of uterus   . Mandible surgery     age 41    Family History  Problem Relation Age of Onset  . Graves' disease Mother   . Hypertension Father   . Mental illness Father     panic attacks  . Hypertension Sister   . Kidney disease Sister   . Hypothyroidism Maternal Grandmother   . Stroke Maternal Grandmother   . Arthritis Maternal Grandmother     RA  . COPD Maternal Grandfather     emphysema  . Parkinsonism Paternal Grandmother   . Alzheimer's disease Paternal Grandfather   . Other Neg Hx     History  Substance Use Topics  . Smoking status: Never Smoker   . Smokeless tobacco: Never Used  . Alcohol Use: No    Allergies: No Known Allergies  Prescriptions prior to admission  Medication Sig Dispense Refill  . cetirizine (ZYRTEC) 10 MG tablet Take 10 mg by mouth daily.      . Prenatal Vit-Fe Fumarate-FA (PRENATAL MULTIVITAMIN) TABS Take 1 tablet by mouth daily.        Review of Systems  All other systems reviewed and are negative.   Physical Exam   Blood pressure 130/72, pulse 75, temperature 97.1 F (36.2  C), temperature source Oral, resp. rate 18, height 5\' 3"  (1.6 m), weight 185 lb (83.915 kg), last menstrual period 08/28/2011, SpO2 100.00%.  Physical Exam  Nursing note and vitals reviewed. Constitutional: She is oriented to person, place, and time. She appears well-developed and well-nourished. She appears distressed.       Breathing through ctx  Neck: Normal range of motion.  Cardiovascular: Normal rate, regular rhythm and normal heart sounds.   Respiratory: Effort normal and breath sounds normal.  GI: Soft. Bowel sounds are normal.  Genitourinary: Vagina normal.       Some bloody show Cx=4/100/-1 vtx, intact   Musculoskeletal: Normal range of motion.  Neurological: She is alert and oriented to person, place, and time. She has normal reflexes.  Skin: Skin is warm and dry.  Psychiatric: She has a normal mood and affect. Her behavior is normal.   FHR 140 reactive Toco 2-5   MAU Course  Procedures    Assessment and Plan  IUP at [redacted]w[redacted]d Early labor Pt requesting pain meds  Will give IVF bolus and nubain/phenergan and reevaluate cervix in a few hours  Cuca Benassi M 05/31/2012, 8:14 AM

## 2012-05-31 NOTE — H&P (Signed)
Cheryl Sanders is a 36 y.o. female presenting for ctx. G3P1 at [redacted]w[redacted]d +bloody show, no LOF, GFM Pt desires TOLAC, consent signed and in chart  rcv'd IVF and nubain/phenergan at 0400, did sleep some, but is now hurting more w ctx, still drowsy  HPI: Pt began [facility] at 7wks. She had [place] at 5wks for bleeding and was WNL. She was tx'd for a UTI +ecoli and TOC was neg. She had anatomy scan at 19wks that noted bilateral fetal pyelectasis, otherwise WNL, F/u US at 22wks, pyelectasis was stable, f/u 26wks pyelectasis had resolve.  1hr gtt was WNL. LGA was noted at 30wks in 85% and most recent US at 38wks >95%.    Maternal Medical History:  Reason for admission: Reason for admission: contractions.  Contractions: Onset was 13-24 hours ago.   Frequency: regular.   Duration is approximately 60 seconds.   Perceived severity is moderate.    Fetal activity: Perceived fetal activity is normal.   Last perceived fetal movement was within the past hour.    Prenatal complications: no prenatal complications   OB History    Grav Para Term Preterm Abortions TAB SAB Ect Mult Living   3 1 1  1     1      Past Medical History  Diagnosis Date  . Advanced maternal age in pregnancy     46  . Urinary tract infection     1st tri  . H/O: C-section     2010  . Smoking history   . Anxiety   . Mental disorder   . GBS carrier   . H/O varicella    Past Surgical History  Procedure Date  . Cesarean section   . Dilation and curettage of uterus   . Mandible surgery     age 61   Family History: family history includes Alzheimer's disease in her paternal grandfather; Arthritis in her maternal grandmother; COPD in her maternal grandfather; Luiz Blare' disease in her mother; Hypertension in her father and sister; Hypothyroidism in her maternal grandmother; Kidney disease in her sister; Mental illness in her father; Parkinsonism in her paternal grandmother; and Stroke in her maternal grandmother.  There is  no history of Other. Social History:  reports that she has never smoked. She has never used smokeless tobacco. She reports that she does not drink alcohol or use illicit drugs.   Prenatal Transfer Tool  Maternal Diabetes: No Genetic Screening: Declined Maternal Ultrasounds/Referrals: Normal bilateral pyelectasis, resolved at 26wks , infant is LGA Fetal Ultrasounds or other Referrals:  None Maternal Substance Abuse:  No Significant Maternal Medications:  None Significant Maternal Lab Results:  None Other Comments:  None  Review of Systems  All other systems reviewed and are negative.    Dilation: 5 Effacement (%): 100 Station: -1 Exam by:: S Noely Kuhnle CNM Blood pressure 95/56, pulse 87, temperature 97.1 F (36.2 C), temperature source Oral, resp. rate 18, height 5\' 3"  (1.6 m), weight 185 lb (83.915 kg), last menstrual period 08/28/2011, SpO2 98.00%. Maternal Exam:  Uterine Assessment: Contraction strength is moderate.  Contraction duration is 50 seconds. Contraction frequency is regular.   Abdomen: Patient reports no abdominal tenderness. Fundal height is LGA.   Estimated fetal weight is 8-9.   Fetal presentation: vertex  Introitus: Normal vulva. Normal vagina.  Pelvis: questionable for delivery.   Narrow pubic arch  Cervix: Cervix evaluated by digital exam.     Fetal Exam Fetal Monitor Review: Mode: ultrasound.   Baseline rate: 130.  Variability: moderate (6-25 bpm).   Pattern: accelerations present and no decelerations.    Fetal State Assessment: Category I - tracings are normal.     Physical Exam  Nursing note and vitals reviewed. Constitutional: She is oriented to person, place, and time. She appears well-developed and well-nourished.       Breathing through ctx  Neck: Normal range of motion.  Cardiovascular: Normal rate, regular rhythm and normal heart sounds.   Respiratory: Effort normal and breath sounds normal.  GI: Soft.  Genitourinary: Vagina normal.    Musculoskeletal: Normal range of motion. She exhibits no edema.  Neurological: She is alert and oriented to person, place, and time. She has normal reflexes.  Skin: Skin is warm and dry.  Psychiatric: She has a normal mood and affect. Her behavior is normal.    Prenatal labs: ABO, Rh: --/--/O POS (09/07 0735) Antibody: NEG (09/07 0735) Rubella: Immune (01/24 0000) RPR: NON REAC (06/06 1016)  HBsAg: Negative (01/24 0000)  HIV: Non-reactive (01/24 0000)  GBS: NEGATIVE (08/13 1401)  GC/CT neg 1hr gtt WNL   Assessment/Plan: IUP at [redacted]w[redacted]d Early/active labor, w small cervical change after therapeutic rest FHR reassuring GBS neg  Admit to B.S per c/w Dr Su Hilt Routine L&D orders Pt desires epidural Pt desires TOLAC, rv'd R/B, including discussing questionable pelvic dimensions and LGA infant   Librada Castronovo M 05/31/2012, 9:11 AM

## 2012-05-31 NOTE — Anesthesia Preprocedure Evaluation (Addendum)
Anesthesia Evaluation  Patient identified by MRN, date of birth, ID band Patient awake    Reviewed: Allergy & Precautions, H&P , NPO status , Patient's Chart, lab work & pertinent test results, reviewed documented beta blocker date and time   History of Anesthesia Complications Negative for: history of anesthetic complications  Airway Mallampati: III TM Distance: >3 FB Neck ROM: full  Mouth opening: Limited Mouth Opening  Dental  (+) Teeth Intact   Pulmonary neg pulmonary ROS,  breath sounds clear to auscultation        Cardiovascular negative cardio ROS  Rhythm:regular Rate:Normal     Neuro/Psych PSYCHIATRIC DISORDERS (anxiety) negative neurological ROS     GI/Hepatic negative GI ROS, Neg liver ROS,   Endo/Other  negative endocrine ROS  Renal/GU negative Renal ROS     Musculoskeletal   Abdominal   Peds  Hematology negative hematology ROS (+)   Anesthesia Other Findings   Reproductive/Obstetrics (+) Pregnancy (h/o prior c/s, attempting VBAC)                           Anesthesia Physical Anesthesia Plan  ASA: II  Anesthesia Plan: Epidural   Post-op Pain Management:    Induction:   Airway Management Planned:   Additional Equipment:   Intra-op Plan:   Post-operative Plan:   Informed Consent: I have reviewed the patients History and Physical, chart, labs and discussed the procedure including the risks, benefits and alternatives for the proposed anesthesia with the patient or authorized representative who has indicated his/her understanding and acceptance.     Plan Discussed with:   Anesthesia Plan Comments:         Anesthesia Quick Evaluation

## 2012-05-31 NOTE — OR Nursing (Signed)
50 ml blood loss during fundal massage by DLWegner RN 

## 2012-05-31 NOTE — Progress Notes (Signed)
Cheryl Sanders is a 36 y.o. G3P1011 at [redacted]w[redacted]d admitted for active labor  Subjective: Pt states she is comfortable with epidural and denies any complaints at present.  Objective: BP 108/65  Pulse 87  Temp 98 F (36.7 C) (Oral)  Resp 18  Ht 5\' 3"  (1.6 m)  Wt 83.915 kg (185 lb)  BMI 32.77 kg/m2  SpO2 98%  LMP 08/28/2011     FHT:  FHR: 150 bpm, variability: moderate,  accelerations:  Present,  decelerations:  Absent UC:   regular, every 2-4 minutes.  MVUs 140.  Pitocin at 87mu/min. SVE:   Dilation: 5 Effacement (%): 100 Station: -1 Exam by:: hk (deferred SVE at present) Labs: Lab Results  Component Value Date   WBC 11.3* 05/31/2012   HGB 12.0 05/31/2012   HCT 36.5 05/31/2012   MCV 84.5 05/31/2012   PLT 250 05/31/2012    Assessment / Plan: TOLAC-IUP at 39w 4d  Labor: Inadequate MVUs at present. Preeclampsia:  no signs or symptoms of toxicity Fetal Wellbeing:  Category I Pain Control:  Epidural I/D:  n/a Anticipated MOD:  VBAC  Continue to titrate pitocin to achieve adequate MVUs and continue expectant management.  Aashir Umholtz O. 05/31/2012, 12:07 PM

## 2012-06-01 LAB — CBC
MCHC: 32.7 g/dL (ref 30.0–36.0)
RDW: 14.7 % (ref 11.5–15.5)

## 2012-06-01 MED ORDER — LACTATED RINGERS IV BOLUS (SEPSIS)
500.0000 mL | Freq: Once | INTRAVENOUS | Status: AC
  Administered 2012-06-01: 500 mL via INTRAVENOUS

## 2012-06-01 NOTE — Progress Notes (Signed)
Cheryl Sanders is a 36 y.o. G3P2011 at [redacted]w[redacted]d admitted for active labor  Subjective: Pt comfortable at present with epidural.    Objective: FHT:  FHR: 155 bpm, variability: moderate,  accelerations:  Present,  decelerations:  Present Occas variable decel and occas early decel. UC:   1.5-3 min.  Resting tone soft between UCs.  MVUs 170-180 and adequate over the past 1hr.  Pitocin at 72mu/min SVE:  Deferred at present. Labs: Lab Results  Component Value Date   WBC 11.3* 05/31/2012   HGB 12.0 05/31/2012   HCT 36.5 05/31/2012   MCV 84.5 05/31/2012   PLT 250 05/31/2012    Assessment / Plan: IUP at 39w 4d Attempted TOLAC with augmentation of labor  Preeclampsia:  no signs or symptoms of toxicity Fetal Wellbeing:  Category I Pain Control:  Epidural I/D:  n/a Anticipated MOD:  NSVD  Continue expectant management.  Continue pitocin to maintain adquate MVUs.  Will repeat SVE after 2hrs of adequate labor.    Cheryl Sanders O. 06/01/2012, 3:30 AM

## 2012-06-01 NOTE — Clinical Social Work Note (Signed)
Clinical Social Work Department PSYCHOSOCIAL ASSESSMENT - MATERNAL/CHILD Name:  Loralyn Freshwater Age: 36 day Referral Date: 06/01/12 Referral source: MD Referral reason:  History of anxiety  I. FAMILY/HOME ENVIRONMENT  Child's legal guardian: Parent(s)  Perlie Mayo parent    DSS  Name:  Rivers Gassmann Age:  36 y/o Address: [address] [place], [place] [address] Name:  Doristine Section Age:  36 y/o Address Same as above  Other Household Members/Support Persons Name:  Jasper Riling Relationship Son DOB 3 y/o        II. PSYCHOSOCIAL DATA A. Information Source  Patient Interview X  Family Interview           Other B. Surveyor, quantity and [employer] Employment  [place] [employer] [facility]     [employer]                             Self Pay  Food Stamps      WIC Work Scientist, physiological Housing      Section 8    Maternity Care Coordination/Child Service Coordination/Early Intervention   School Grade      Other Cultural and Environment Information Cultural Issues Impacting Care  III. STRENGTHS  Supportive family/friends Yes   Adequate Resources  Yes Compliance with medical plan  Home prepared for Child (including basic supplies)  Yes Understanding of illness           Other  IV. RISK FACTORS AND CURRENT PROBLEMS V. No Problems Noted VI. Substance Abuse                                           Pt Family VII. Mental Illness     Pt Family               Family/Relationship Issues   Pt Family      Abuse/Neglect/Domestic Violence   Pt Family   Financial Resources     Pt Family  Transportation     Pt Family  DSS Involvement    Pt Family  Adjustment to Illness    Pt Family   Knowledge/Cognitive Deficit   Pt Family   Compliance with Treatment   Pt Family   Basic Needs (food, housing, etc)  Pt Family  Housing Concerns    Pt Family  Other             VIII. SOCIAL WORK ASSESSMENT SW received consult due to pt's  history of anxiety.  Pt stated she has social anxiety and was diagnosed in 2004.  She took Paxil for a year and received counseling.  She is aware of her symptoms and is able to talk herself through the anxiety now.  Pt's husband was also present during the visit and is aware of pt's anxiety.  Pt was appropriate during assessment and displayed a bright affect at times.    She works for the [employer].  She has Media planner.  Pt stated she has family and friends who are very supportive.  Pt also has supplies.  No concerns for discharge.  Pt expressed no any needs or concerns.  IX. SOCIAL WORK PLAN (In [place]) No Further Intervention Required/No Barriers to Discharge Psychosocial Support and Ongoing Assessment of Needs  Patient/Family  Education Child Protective Services Report  [place]        Date Information/Referral to [employer]   Other

## 2012-06-01 NOTE — Anesthesia Postprocedure Evaluation (Signed)
  Anesthesia Post-op Note  Patient: Cheryl Sanders  Procedure(s) Performed: Procedure(s) (LRB) with comments: CESAREAN SECTION (N/A) - with Bilateral tubal ligation  Patient Location: Mother/Baby  Anesthesia Type: Epidural  Level of Consciousness: awake, alert  and oriented  Airway and Oxygen Therapy: Patient Spontanous Breathing  Post-op Pain: mild  Post-op Assessment: Patient's Cardiovascular Status Stable, Respiratory Function Stable, Patent Airway, No signs of Nausea or vomiting and Pain level controlled  Post-op Vital Signs: stable  Complications: No apparent anesthesia complications

## 2012-06-01 NOTE — Addendum Note (Signed)
Addendum  created 06/01/12 1011 by Lincoln Brigham, CRNA   Modules edited:Notes Section

## 2012-06-01 NOTE — Progress Notes (Addendum)
Subjective: Postpartum Day 1: Cesarean Delivery Patient reports tolerating PO and + flatus.  Foley still in.  Pt last had breakfast.  Reports VB light.  BF'ng going well. No dizziness when up OOB.  Pt was on toilet on my arrival to room to assess urine catheter.  Objective: Vital signs in last 24 hours: Temp:  [97.6 F (36.4 C)-100 F (37.8 C)] 98.3 F (36.8 C) (09/08 1300) Pulse Rate:  [71-126] 79  (09/08 1300) Resp:  [12-28] 18  (09/08 1300) BP: (98-145)/(52-112) 120/64 mmHg (09/08 1300) SpO2:  [92 %-100 %] 98 % (09/08 1300) .Marland KitchenI/O last 3 completed shifts: In: 1900 [I.V.:1900] Out: 1400 [Urine:600; Blood:800] Total I/O In: -  Out: 1250 [Urine:1250]   Physical Exam:  General: alert, cooperative, appears stated age and no distress Lochia: appropriate Uterine Fundus: firm Incision: post-op dsg remains c/d/i DVT Evaluation: No evidence of DVT seen on physical exam. Negative Homan's sign. Foley:  Urine still bloody; some particles/clots in bag; color is red and looks like rasberry tea  Basename 06/01/12 0509 05/31/12 0735  HGB 10.8* 12.0  HCT 33.0* 36.5    Assessment/Plan: Status post Cesarean section. Postoperative course complicated by blood in urine  Dr. Su Hilt updated on urine color and character, and will CTO closely.  UOP WNL.  Will make NPO at present in case cystoscopy rec'd as day progresses.  STEELMAN,CANDICE H 06/01/2012, 2:33 PM  Pt was seen by me this morning at about 7am.  Urine still bloody but dark colored.  Will continue to observe for now.  Foley bag changed and LR continued.  At about 6pm urine was still bloody.  I flushed the bladder with about 4 liters of NS until it cleared and lots of clot intermittently was removed.  The foley was then replaced and a few small clots came out but fluid remained clear (dilute yellow).  Pt tolerated procedure well.  Will give a one time dose of 1g of ancef and leave fluids running and foley in overnight.  Windell Moulding (RN) informed  if urine is clear in am to remove foley per usual protocol but if it is not, then call MD or CNM to eval prior to removal.  CBC in am.

## 2012-06-02 ENCOUNTER — Encounter (HOSPITAL_COMMUNITY): Payer: Self-pay | Admitting: *Deleted

## 2012-06-02 DIAGNOSIS — O34219 Maternal care for unspecified type scar from previous cesarean delivery: Secondary | ICD-10-CM | POA: Diagnosis not present

## 2012-06-02 LAB — CBC
HCT: 31.3 % — ABNORMAL LOW (ref 36.0–46.0)
Hemoglobin: 10.1 g/dL — ABNORMAL LOW (ref 12.0–15.0)
RDW: 14.7 % (ref 11.5–15.5)
WBC: 15.6 10*3/uL — ABNORMAL HIGH (ref 4.0–10.5)

## 2012-06-02 NOTE — Progress Notes (Signed)
Subjective: Postpartum Day 2: Cesarean Delivery with BTL-- due to fetal tachycardia Patient reports foley removed this am, with spontaneous voiding after removal.  No hematuria or dysuria. Feeding:  Breast Contraceptive:  BTL with C/S  Objective: Vital signs in last 24 hours: Temp:  [97.4 F (36.3 C)-98.3 F (36.8 C)] 98.2 F (36.8 C) (09/08 2300) Pulse Rate:  [79-98] 91  (09/09 0500) Resp:  [18] 18  (09/09 0500) BP: (98-120)/(61-65) 98/64 mmHg (09/09 0500) SpO2:  [94 %-99 %] 94 % (09/09 0500)  Physical Exam:  General: alert Lochia: appropriate Uterine Fundus: firm Incision: Dressing CDI DVT Evaluation: No evidence of DVT seen on physical exam. Negative Homan's sign. JP drain:   NA   Basename 06/02/12 0444 06/01/12 0509  HGB 10.1* 10.8*  HCT 31.3* 33.0*    Assessment/Plan: Status post Cesarean section and BTL Resolution of hematuria Unsuccessful TOLAC  Continue current care. Reviewed birth experience with patient and husband--support given for patient's efforts. Patient will observe for hematuria, dysuria, or any other urinary issues. Anticipate d/c tomorrow.  Marland Kitchen  Nigel Bridgeman 06/02/2012, 8:54 AM

## 2012-06-03 ENCOUNTER — Encounter (HOSPITAL_COMMUNITY): Payer: Self-pay | Admitting: Obstetrics and Gynecology

## 2012-06-03 MED ORDER — CETIRIZINE HCL 10 MG PO TABS: 10.0000 mg | ORAL_TABLET | Freq: Every day | ORAL | Status: DC

## 2012-06-03 MED ORDER — IBUPROFEN 600 MG PO TABS: 600.0000 mg | ORAL_TABLET | Freq: Four times a day (QID) | ORAL | Status: AC | PRN

## 2012-06-03 MED ORDER — OXYCODONE-ACETAMINOPHEN 5-325 MG PO TABS: 1.0000 | ORAL_TABLET | ORAL | Status: AC | PRN

## 2012-06-03 MED ORDER — PRENATAL MULTIVITAMIN CH: 1.0000 | ORAL_TABLET | Freq: Every day | ORAL | Status: DC

## 2012-06-03 NOTE — Discharge Summary (Signed)
Physician Discharge Summary  Patient ID: KRISINDA GIOVANNI MRN: [id number] DOB/AGE: 1976/04/06 36 y.o.  Admit date: 05/31/2012 Discharge date: 06/03/2012  Admission Diagnoses:admitted in active labor at 5cms /100%/-1, EFW: 8-9 lbs Hx of LGA and 38 weeks 85th%tile attempting an VBAC Discharge Diagnoses:  Principal Problem:  *Unsuccessful VBAC--repeat C/S Active Problems:  H/O: C-section  AMA (advanced maternal age) multigravida 35+  LGA (large for gestational age) fetus  Pyelectasis   Discharged Condition: stable  Hospital Course:  Patient was admitted in active labor GA 39w.5d at 5cms, Progressed well to 10 cms and pushed but FHT was non-reassuring with fetal tachycardia. Dr Su Hilt assessed and a d was made to perform RLTCS.  Viable Female was delivered by C/S. The bladder was adhered to the uterine wall and the patient had hematuria D1 post - op. The indwelling catheter was flushed by Dr. Su Hilt and hematuria resolved. The patient recovered well and progressed well postpartum. Baby "Barbee Shropshire"  had circumcision at the hospital, uncomplicated. The mother and baby were discharged on D3 post op. Advised to return to [facility] if bladder function has not returned in 10 days. To schedule appointment in 6 weeks for pp visit. BTL as birth control    Consults: none  Significant Diagnostic Studies: radiology: CT scan:  Head CT - neg  Treatments: IV hydration, epidural, IV antibiotics  Discharge Exam: Blood pressure 137/82, pulse 84, temperature 97.9 F (36.6 C), temperature source Oral, resp. rate 18, height 5\' 3"  (1.6 m), weight 185 lb (83.915 kg), last menstrual period 08/28/2011, SpO2 94.00%, unknown if currently breastfeeding. Affect; AAO x 3 Lungs; CTAB CV: RRR Abdomen, soft. N/T, Incision D&I, Steristrips remain., fundus at -3/u,  B/S x 4 Lochia minimal, Rubra. GI: flatus GU: Urine straw colored. Patient states "does not have urge to void but is emptying bladder every 3-4 hrs as a  routine". Advised to start and stop urine flow when voiding to help with bladder rehab. Advised to contact [facility] within 10 days if bladder function has not returned. Extremities: feet slightly swollen and advised to elevate feet.  Disposition:  Discharged to home  Discharge Orders    Future Orders Please Complete By Expires   OB RESULTS CONSOLE GC/Chlamydia      Comments:   This external order was created through the Results Console.   OB RESULTS CONSOLE RPR      Comments:   This external order was created through the Results Console.   OB RESULTS CONSOLE HIV antibody      Comments:   This external order was created through the Results Console.   OB RESULTS CONSOLE Rubella Antibody      Comments:   This external order was created through the Results Console.   OB RESULTS CONSOLE Hepatitis B surface antigen      Comments:   This external order was created through the Results Console.   OB RESULTS CONSOLE ABO/Rh      Comments:   This external order was created through the Results Console.   OB RESULTS CONSOLE Antibody Screen      Comments:   This external order was created through the Results Console.     Medication List  As of 06/03/2012  9:07 AM   ASK your doctor about these medications         cetirizine 10 MG tablet   Commonly known as: ZYRTEC   Take 10 mg by mouth daily.      prenatal multivitamin Tabs   Take 1  tablet by mouth daily.           Follow-up Information    Follow up with [facility] in 6 weeks. (As needed)         RLTCS for NRFHT and  Fetal Tachycardia. BTL . Female infant "Theodor" with Dr Su Hilt and SL  Signed: Earl Gala, CNM. 06/03/2012, 9:07 AM

## 2012-06-06 ENCOUNTER — Encounter: Payer: 59 | Admitting: Obstetrics and Gynecology

## 2012-07-11 ENCOUNTER — Encounter: Payer: Self-pay | Admitting: Obstetrics and Gynecology

## 2012-07-11 ENCOUNTER — Ambulatory Visit (INDEPENDENT_AMBULATORY_CARE_PROVIDER_SITE_OTHER): Payer: 59 | Admitting: Obstetrics and Gynecology

## 2012-07-11 NOTE — Progress Notes (Signed)
Date of delivery: 05/31/2012 Female Name: Normand Sloop Vaginal delivery:no Cesarean section:yes Tubal ligation:yes GDM:no Breast Feeding:no Bottle Feeding:yes Post-Partum Blues:no Abnormal pap:no Normal GU function: yes Normal GI function:yes Returning to work:no EPDS: Score-2  Filed Vitals:   07/11/12 1109  BP: 110/70  Temp: 98 F (36.7 C)   ROS: noncontributory  Pelvic exam:  VULVA: normal appearing vulva with no masses, tenderness or lesions,  VAGINA: normal appearing vagina with normal color and discharge, no lesions, CERVIX: normal appearing cervix without discharge or lesions,  UTERUS: uterus is normal size, shape, consistency and nontender,  ADNEXA: normal adnexa in size, nontender and no masses.  Well healed incision  A/P AEX next available

## 2014-04-01 ENCOUNTER — Ambulatory Visit (INDEPENDENT_AMBULATORY_CARE_PROVIDER_SITE_OTHER): Payer: 59 | Admitting: Family Medicine

## 2014-04-01 VITALS — BP 112/70 | HR 86 | Temp 98.0°F | Resp 16 | Ht 60.0 in | Wt 160.4 lb

## 2014-04-01 DIAGNOSIS — M545 Low back pain, unspecified: Secondary | ICD-10-CM

## 2014-04-01 DIAGNOSIS — R109 Unspecified abdominal pain: Secondary | ICD-10-CM

## 2014-04-01 DIAGNOSIS — R319 Hematuria, unspecified: Secondary | ICD-10-CM

## 2014-04-01 DIAGNOSIS — R52 Pain, unspecified: Secondary | ICD-10-CM

## 2014-04-01 LAB — POCT UA - MICROSCOPIC ONLY
Bacteria, U Microscopic: NEGATIVE
CASTS, UR, LPF, POC: NEGATIVE
Crystals, Ur, HPF, POC: NEGATIVE
YEAST UA: NEGATIVE

## 2014-04-01 LAB — POCT URINALYSIS DIPSTICK
Bilirubin, UA: NEGATIVE
GLUCOSE UA: NEGATIVE
Ketones, UA: 15
Leukocytes, UA: NEGATIVE
NITRITE UA: NEGATIVE
PH UA: 5.5
Protein, UA: NEGATIVE
Spec Grav, UA: 1.015
UROBILINOGEN UA: 0.2

## 2014-04-01 NOTE — Progress Notes (Signed)
Subjective:    Patient ID: Cheryl Sanders, female    DOB: 1976-07-23, 38 y.o.   MRN: [id number]  HPI Cheryl Sanders is a 38 y.o. female  Back pain started this am - sharp initially, now dull pain, more persistent through morning.  Did have some neck pain past few weeks, but that is better. No new exercises, or activities known. R mid back. No new urinary sx's. No blood in urine.  No hx of kidney stone. No regular medical problems.  Does have 2 yo son, but NKI, and busier at work - [employer]. No abd pain. No n/v.  Last UTI 4 months ago.  LMP - 03/18/14.   Sciatica with 2nd pregnancy.  No bowel or bladder incontinence, no saddle anesthesia, no lower extremity weakness.    Tx:  Advil.     Patient Active Problem List   Diagnosis Date Noted  . Pregnant state, incidental 05/20/2012  . LGA (large for gestational age) fetus 03/12/2012  . Normal pregnancy, repeat 01/30/2012  . H/O: C-section 01/03/2012  . AMA (advanced maternal age) multigravida 35+ 01/03/2012   Past Medical History  Diagnosis Date  . Advanced maternal age in pregnancy     7  . Urinary tract infection     1st tri  . H/O: C-section     2010  . Smoking history   . Anxiety   . Mental disorder   . GBS carrier   . H/O varicella    Past Surgical History  Procedure Laterality Date  . Cesarean section    . Dilation and curettage of uterus    . Mandible surgery      age 32  . Cesarean section  05/31/2012    Procedure: CESAREAN SECTION;  Surgeon: Purcell Nails, MD;  Location: [facility];  Service: Obstetrics;  Laterality: N/A;  with Bilateral tubal ligation   No Known Allergies Prior to Admission medications   Medication Sig Start Date End Date Taking? Authorizing Provider  cetirizine (ZYRTEC) 10 MG tablet Take 1 tablet (10 mg total) by mouth daily. 06/03/12  Yes Earl Gala, CNM   History   Social History  . Marital Status: Married    Spouse Name: N/A    Number of Children: N/A  . Years of  Education: N/A   Occupational History  . Not on file.   Social History Main Topics  . Smoking status: Never Smoker   . Smokeless tobacco: Never Used  . Alcohol Use: No  . Drug Use: No  . Sexual Activity: Not Currently    Birth Control/ Protection: None   Other Topics Concern  . Not on file   Social History Narrative  . No narrative on file     Review of Systems  Constitutional: Negative for fever and chills.  Gastrointestinal: Negative for nausea, vomiting and abdominal pain.       Lower abdomen/bladder area  Genitourinary: Positive for flank pain. Negative for dysuria, urgency, frequency, hematuria, vaginal bleeding, vaginal discharge, difficulty urinating, vaginal pain, menstrual problem and pelvic pain.  Musculoskeletal: Positive for back pain. Negative for gait problem.        Objective:   Physical Exam  Vitals reviewed. Constitutional: She is oriented to person, place, and time. She appears well-developed and well-nourished. No distress.  HENT:  Head: Normocephalic and atraumatic.  Pulmonary/Chest: Effort normal.  Abdominal: Soft. Bowel sounds are normal. There is no tenderness.  Musculoskeletal: Normal range of motion. She exhibits no edema and no tenderness.  Thoracic back: She exhibits normal range of motion and no tenderness.       Lumbar back: She exhibits no tenderness and no bony tenderness.       Back:  Neurological: She is alert and oriented to person, place, and time.  Skin: Skin is warm and dry. No rash noted.  Psychiatric: She has a normal mood and affect. Her behavior is normal.   Filed Vitals:   04/01/14 1335  BP: 112/70  Pulse: 86  Temp: 98 F (36.7 C)  TempSrc: Oral  Resp: 16  Height: 5' (1.524 m)  Weight: 160 lb 6.4 oz (72.757 kg)  SpO2: 98%    Results for orders placed in visit on 04/01/14  POCT UA - MICROSCOPIC ONLY      Result Value Ref Range   WBC, Ur, HPF, POC 0-2     RBC, urine, microscopic 0-1     Bacteria, U  Microscopic neg     Mucus, UA trace     Epithelial cells, urine per micros 0-3     Crystals, Ur, HPF, POC neg     Casts, Ur, LPF, POC neg     Yeast, UA neg    POCT URINALYSIS DIPSTICK      Result Value Ref Range   Color, UA dark yellow     Clarity, UA slightly cloudy     Glucose, UA neg     Bilirubin, UA neg     Ketones, UA 15     Spec Grav, UA 1.015     Blood, UA moderate     pH, UA 5.5     Protein, UA neg     Urobilinogen, UA 0.2     Nitrite, UA neg     Leukocytes, UA Negative        Assessment & Plan:  Sheilah MinsCharity W Sanders is a 38 y.o. female Right-sided low back pain without sciatica - Plan: POCT UA - Microscopic Only, POCT urinalysis dipstick  Acute right flank pain  Hematuria  - suspected MSK source of pain - strain/lumbago, but with small amt of hematuria - DDX of nephrolith. Filter given, increase fluids, discussed mobic, but she  would like to try otc advil for now. Recheck in next few days if not improving as may need CT, sooner if worse. Avoid heavy lifting and try to avoid lifting children as much as possible for now. Back care manual given.   No orders of the defined types were placed in this encounter.   Patient Instructions  The urine test did pick up a small amount of blood - kidney stone is possible, but also from muscles possible.  Increase fluid intake next few days, use filter when urinating, and if any stones passed - bring in for evaluation. If not improving in next 2-3 days - return for recheck as xray or other imaging may be required. advil ok for now, range of motion and other information in the back care manual.  Return to the clinic or go to the nearest emergency room if any of your symptoms worsen or new symptoms occur.   Back Pain, Adult Low back pain is very common. About 1 in 5 people have back pain.The cause of low back pain is rarely dangerous. The pain often gets better over time.About half of people with a sudden onset of back pain feel  better in just 2 weeks. About 8 in 10 people feel better by 6 weeks.  CAUSES Some common causes of back pain  include:  Strain of the muscles or ligaments supporting the spine.  Wear and tear (degeneration) of the spinal discs.  Arthritis.  Direct injury to the back. DIAGNOSIS Most of the time, the direct cause of low back pain is not known.However, back pain can be treated effectively even when the exact cause of the pain is unknown.Answering your caregiver's questions about your overall health and symptoms is one of the most accurate ways to make sure the cause of your pain is not dangerous. If your caregiver needs more information, he or she may order lab work or imaging tests (X-rays or MRIs).However, even if imaging tests show changes in your back, this usually does not require surgery. HOME CARE INSTRUCTIONS For many people, back pain returns.Since low back pain is rarely dangerous, it is often a condition that people can learn to [facility] their own.   Remain active. It is stressful on the back to sit or stand in one place. Do not sit, drive, or stand in one place for more than 30 minutes at a time. Take short walks on level surfaces as soon as pain allows.Try to increase the length of time you walk each day.  Do not stay in bed.Resting more than 1 or 2 days can delay your recovery.  Do not avoid exercise or work.Your body is made to move.It is not dangerous to be active, even though your back may hurt.Your back will likely heal faster if you return to being active before your pain is gone.  Pay attention to your body when you bend and lift. Many people have less discomfortwhen lifting if they bend their knees, keep the load close to their bodies,and avoid twisting. Often, the most comfortable positions are those that put less stress on your recovering back.  Find a comfortable position to sleep. Use a firm mattress and lie on your side with your knees slightly bent. If you  lie on your back, put a pillow under your knees.  Only take over-the-counter or prescription medicines as directed by your caregiver. Over-the-counter medicines to reduce pain and inflammation are often the most helpful.Your caregiver may prescribe muscle relaxant drugs.These medicines help dull your pain so you can more quickly return to your normal activities and healthy exercise.  Put ice on the injured area.  Put ice in a plastic bag.  Place a towel between your skin and the bag.  Leave the ice on for 15-20 minutes, 03-04 times a day for the first 2 to 3 days. After that, ice and heat may be alternated to reduce pain and spasms.  Ask your caregiver about trying back exercises and gentle massage. This may be of some benefit.  Avoid feeling anxious or stressed.Stress increases muscle tension and can worsen back pain.It is important to recognize when you are anxious or stressed and learn ways to manage it.Exercise is a great option. SEEK MEDICAL CARE IF:  You have pain that is not relieved with rest or medicine.  You have pain that does not improve in 1 week.  You have new symptoms.  You are generally not feeling well. SEEK IMMEDIATE MEDICAL CARE IF:   You have pain that radiates from your back into your legs.  You develop new bowel or bladder control problems.  You have unusual weakness or numbness in your arms or legs.  You develop nausea or vomiting.  You develop abdominal pain.  You feel faint. Document Released: 09/10/2005 Document Revised: 03/11/2012 Document Reviewed: 01/29/2011 [employer] Patient Information 2015  [employer]. This information is not intended to replace advice given to you by your health care provider. Make sure you discuss any questions you have with your health care provider.

## 2014-04-01 NOTE — Patient Instructions (Addendum)
The urine test did pick up a small amount of blood - kidney stone is possible, but also from muscles possible.  Increase fluid intake next few days, use filter when urinating, and if any stones passed - bring in for evaluation. If not improving in next 2-3 days - return for recheck as xray or other imaging may be required. advil ok for now, range of motion and other information in the back care manual.  Return to the clinic or go to the nearest emergency room if any of your symptoms worsen or new symptoms occur.   Back Pain, Adult Low back pain is very common. About 1 in 5 people have back pain.The cause of low back pain is rarely dangerous. The pain often gets better over time.About half of people with a sudden onset of back pain feel better in just 2 weeks. About 8 in 10 people feel better by 6 weeks.  CAUSES Some common causes of back pain include:  Strain of the muscles or ligaments supporting the spine.  Wear and tear (degeneration) of the spinal discs.  Arthritis.  Direct injury to the back. DIAGNOSIS Most of the time, the direct cause of low back pain is not known.However, back pain can be treated effectively even when the exact cause of the pain is unknown.Answering your caregiver's questions about your overall health and symptoms is one of the most accurate ways to make sure the cause of your pain is not dangerous. If your caregiver needs more information, he or she may order lab work or imaging tests (X-rays or MRIs).However, even if imaging tests show changes in your back, this usually does not require surgery. HOME CARE INSTRUCTIONS For many people, back pain returns.Since low back pain is rarely dangerous, it is often a condition that people can learn to [facility] their own.   Remain active. It is stressful on the back to sit or stand in one place. Do not sit, drive, or stand in one place for more than 30 minutes at a time. Take short walks on level surfaces as soon as pain  allows.Try to increase the length of time you walk each day.  Do not stay in bed.Resting more than 1 or 2 days can delay your recovery.  Do not avoid exercise or work.Your body is made to move.It is not dangerous to be active, even though your back may hurt.Your back will likely heal faster if you return to being active before your pain is gone.  Pay attention to your body when you bend and lift. Many people have less discomfortwhen lifting if they bend their knees, keep the load close to their bodies,and avoid twisting. Often, the most comfortable positions are those that put less stress on your recovering back.  Find a comfortable position to sleep. Use a firm mattress and lie on your side with your knees slightly bent. If you lie on your back, put a pillow under your knees.  Only take over-the-counter or prescription medicines as directed by your caregiver. Over-the-counter medicines to reduce pain and inflammation are often the most helpful.Your caregiver may prescribe muscle relaxant drugs.These medicines help dull your pain so you can more quickly return to your normal activities and healthy exercise.  Put ice on the injured area.  Put ice in a plastic bag.  Place a towel between your skin and the bag.  Leave the ice on for 15-20 minutes, 03-04 times a day for the first 2 to 3 days. After that, ice and  heat may be alternated to reduce pain and spasms.  Ask your caregiver about trying back exercises and gentle massage. This may be of some benefit.  Avoid feeling anxious or stressed.Stress increases muscle tension and can worsen back pain.It is important to recognize when you are anxious or stressed and learn ways to manage it.Exercise is a great option. SEEK MEDICAL CARE IF:  You have pain that is not relieved with rest or medicine.  You have pain that does not improve in 1 week.  You have new symptoms.  You are generally not feeling well. SEEK IMMEDIATE MEDICAL CARE  IF:   You have pain that radiates from your back into your legs.  You develop new bowel or bladder control problems.  You have unusual weakness or numbness in your arms or legs.  You develop nausea or vomiting.  You develop abdominal pain.  You feel faint. Document Released: 09/10/2005 Document Revised: 03/11/2012 Document Reviewed: 01/29/2011 [facility] Patient Information 2015 [place], [place]. This information is not intended to replace advice given to you by your health care provider. Make sure you discuss any questions you have with your health care provider.

## 2015-06-28 ENCOUNTER — Encounter: Payer: Self-pay | Admitting: Emergency Medicine

## 2016-10-26 ENCOUNTER — Ambulatory Visit (INDEPENDENT_AMBULATORY_CARE_PROVIDER_SITE_OTHER): Payer: Commercial Managed Care - HMO | Admitting: Family Medicine

## 2016-10-26 ENCOUNTER — Encounter: Payer: Self-pay | Admitting: Family Medicine

## 2016-10-26 VITALS — BP 134/80 | HR 92 | Temp 97.8°F | Resp 18 | Ht 60.0 in | Wt 134.2 lb

## 2016-10-26 DIAGNOSIS — J069 Acute upper respiratory infection, unspecified: Secondary | ICD-10-CM | POA: Diagnosis not present

## 2016-10-26 DIAGNOSIS — R6889 Other general symptoms and signs: Secondary | ICD-10-CM

## 2016-10-26 DIAGNOSIS — J101 Influenza due to other identified influenza virus with other respiratory manifestations: Secondary | ICD-10-CM

## 2016-10-26 LAB — POCT INFLUENZA A/B
Influenza A, POC: NEGATIVE
Influenza B, POC: POSITIVE — AB

## 2016-10-26 NOTE — Patient Instructions (Addendum)
   IF you received an x-ray today, you will receive an invoice from [facility]. Please contact [facility] at [phone] with questions or concerns regarding your invoice.   IF you received labwork today, you will receive an invoice from [facility]. Please contact [facility] at [phone] with questions or concerns regarding your invoice.   Our billing staff will not be able to assist you with questions regarding bills from these companies.  You will be contacted with the lab results as soon as they are available. The fastest way to get your results is to activate your My Chart account. Instructions are located on the last page of this paperwork. If you have not heard from us regarding the results in 2 weeks, please contact this office.      Influenza, Adult Influenza, more commonly known as "the flu," is a viral infection that primarily affects the respiratory tract. The respiratory tract includes organs that help you breathe, such as the lungs, nose, and throat. The flu causes many common cold symptoms, as well as a high fever and body aches. The flu spreads easily from person to person (is contagious). Getting a flu shot (influenza vaccination) every year is the best way to prevent influenza. What are the causes? Influenza is caused by a virus. You can catch the virus by:  Breathing in droplets from an infected person's cough or sneeze.  Touching something that was recently contaminated with the virus and then touching your mouth, nose, or eyes.  What increases the risk? The following factors may make you more likely to get the flu:  Not cleaning your hands frequently with soap and water or alcohol-based hand sanitizer.  Having close contact with many people during cold and flu season.  Touching your mouth, eyes, or nose without washing or sanitizing your hands first.  Not drinking enough fluids or not eating a healthy diet.  Not getting enough sleep or  exercise.  Being under a high amount of stress.  Not getting a yearly (annual) flu shot.  You may be at a higher risk of complications from the flu, such as a severe lung infection (pneumonia), if you:  Are over the age of 65.  Are pregnant.  Have a weakened disease-fighting system (immune system). You may have a weakened immune system if you: ? Have HIV or AIDS. ? Are undergoing chemotherapy. ? Aretaking medicines that reduce the activity of (suppress) the immune system.  Have a long-term (chronic) illness, such as heart disease, kidney disease, diabetes, or lung disease.  Have a liver disorder.  Are obese.  Have anemia.  What are the signs or symptoms? Symptoms of this condition typically last 4-10 days and may include:  Fever.  Chills.  Headache, body aches, or muscle aches.  Sore throat.  Cough.  Runny or congested nose.  Chest discomfort and cough.  Poor appetite.  Weakness or tiredness (fatigue).  Dizziness.  Nausea or vomiting.  How is this diagnosed? This condition may be diagnosed based on your medical history and a physical exam. Your health care provider may do a nose or throat swab test to confirm the diagnosis. How is this treated? If influenza is detected early, you can be treated with antiviral medicine that can reduce the length of your illness and the severity of your symptoms. This medicine may be given by mouth (orally) or through an IV tube that is inserted in one of your veins. The goal of treatment is to relieve symptoms by taking   care of yourself at home. This may include taking over-the-counter medicines, drinking plenty of fluids, and adding humidity to the air in your home. In some cases, influenza goes away on its own. Severe influenza or complications from influenza may be treated in a hospital. Follow these instructions at home:  Take over-the-counter and prescription medicines only as told by your health care provider.  Use a  cool mist humidifier to add humidity to the air in your home. This can make breathing easier.  Rest as needed.  Drink enough fluid to keep your urine clear or pale yellow.  Cover your mouth and nose when you cough or sneeze.  Wash your hands with soap and water often, especially after you cough or sneeze. If soap and water are not available, use hand sanitizer.  Stay home from work or school as told by your health care provider. Unless you are visiting your health care provider, try to avoid leaving home until your fever has been gone for 24 hours without the use of medicine.  Keep all follow-up visits as told by your health care provider. This is important. How is this prevented?  Getting an annual flu shot is the best way to avoid getting the flu. You may get the flu shot in late summer, fall, or winter. Ask your health care provider when you should get your flu shot.  Wash your hands often or use hand sanitizer often.  Avoid contact with people who are sick during cold and flu season.  Eat a healthy diet, drink plenty of fluids, get enough sleep, and exercise regularly. Contact a health care provider if:  You develop new symptoms.  You have: ? Chest pain. ? Diarrhea. ? A fever.  Your cough gets worse.  You produce more mucus.  You feel nauseous or you vomit. Get help right away if:  You develop shortness of breath or difficulty breathing.  Your skin or nails turn a bluish color.  You have severe pain or stiffness in your neck.  You develop a sudden headache or sudden pain in your face or ear.  You cannot stop vomiting. This information is not intended to replace advice given to you by your health care provider. Make sure you discuss any questions you have with your health care provider. Document Released: 09/07/2000 Document Revised: 02/16/2016 Document Reviewed: 07/05/2015 Elsevier Interactive Patient Education  2017 [employer]  

## 2016-10-26 NOTE — Progress Notes (Signed)
  Chief Complaint  Patient presents with  . Flu like symptoms    HPI   Onset of symptoms 4 ago with progressive symptoms that include fevers, chills, headaches, sore throat, nausea and but no diarrhea.   There is history of asthma There are no sick contacts Patient did not receive a flu vaccine She reports a temperature of 100.8 overnight She has been taking robitussin for cough She reports that she can hear herself wheezing. She has been drinking fluids at home.  Past Medical History:  Diagnosis Date  . Advanced maternal age in pregnancy    [age]  . Anxiety   . GBS carrier   . H/O varicella   . H/O: C-section    2010  . Mental disorder   . Smoking history   . Urinary tract infection    1st tri    Current Outpatient Prescriptions  Medication Sig Dispense Refill  . cetirizine (ZYRTEC) 10 MG tablet Take 1 tablet (10 mg total) by mouth daily. 30 tablet 2  . dextromethorphan (ROBITUSSIN 12 HOUR COUGH) 30 MG/5ML liquid Take by mouth as needed for cough.    . Fexofenadine HCl (MUCINEX ALLERGY PO) Take by mouth.    . fluticasone (FLONASE) 50 MCG/ACT nasal spray Place into both nostrils daily.     No current facility-administered medications for this visit.     Allergies: No Known Allergies  Past Surgical History:  Procedure Laterality Date  . CESAREAN SECTION    . CESAREAN SECTION  05/31/2012   Procedure: CESAREAN SECTION;  Surgeon: Purcell NailsAngela Y Roberts, MD;  Location: [facility];  Service: Obstetrics;  Laterality: N/A;  with Bilateral tubal ligation  . DILATION AND CURETTAGE OF UTERUS    . MANDIBLE SURGERY     age 41    Social History   Social History  . Marital status: Married    Spouse name: N/A  . Number of children: N/A  . Years of education: N/A   Social History Main Topics  . Smoking status: Never Smoker  . Smokeless tobacco: Never Used  . Alcohol use No  . Drug use: No  . Sexual activity: Not Currently    Birth control/ protection: None   Other Topics Concern    . None   Social History Narrative  . None    ROS See hpi  Objective: Vitals:   10/26/16 0832  BP: 134/80  Pulse: 92  Resp: 18  Temp: 97.8 F (36.6 C)  TempSrc: Oral  SpO2: 99%  Weight: 134 lb 3.2 oz (60.9 kg)  Height: 5' (1.524 m)    Physical Exam General: alert, oriented, in NAD Head: normocephalic, atraumatic, no sinus tenderness Eyes: EOM intact, no scleral icterus or conjunctival injection Ears: TM clear bilaterally Throat: no pharyngeal exudate or erythema Lymph: no posterior auricular, submental or cervical lymph adenopathy Heart: normal rate, normal sinus rhythm, no murmurs Lungs: clear to auscultation bilaterally, no wheezing  Influenza Type B- positive   Assessment and Plan Georgine was seen today for flu like symptoms.  Diagnoses and all orders for this visit:  Acute URI Flu-like symptoms  Discussed that she should get her 41yo the tamiflu Otherwise her household immunocompetent contacts should wash hand and stay hydrated -     POCT Influenza A/B     Tashianna Broome A Davier Tramell

## 2016-10-29 ENCOUNTER — Ambulatory Visit: Payer: Commercial Managed Care - HMO

## 2016-10-29 ENCOUNTER — Ambulatory Visit (INDEPENDENT_AMBULATORY_CARE_PROVIDER_SITE_OTHER): Payer: Commercial Managed Care - HMO | Admitting: Physician Assistant

## 2016-10-29 VITALS — BP 128/80 | HR 76 | Temp 98.0°F | Resp 16 | Ht 61.0 in | Wt 133.0 lb

## 2016-10-29 DIAGNOSIS — J101 Influenza due to other identified influenza virus with other respiratory manifestations: Secondary | ICD-10-CM | POA: Diagnosis not present

## 2016-10-29 NOTE — Progress Notes (Signed)
Patient ID: Cheryl Sanders, female    DOB: 1975/12/06, 41 y.o.   MRN: [id number]  PCP: Georgianne FickAMACHANDRAN,AJITH, MD  Chief Complaint  Patient presents with  . Follow-up    wheezing     Subjective:   Presents for evaluation of recent influenza-type B illness.  This patient was seen here on 10/26/2016 and was diagnosed with Flu type B. Had been seen at the clinic at [place] [facility] and already had guaifenesin and promethazine-containing cough syrup, which she was advised to continue. Her husband has since started sneezing, but no fever, chills, body aches, cough or sore throat.  Yesterday she noticed a lot of wheezing. Chest pain. Seen at [facility] in [place], where CXR was reportedly clear. She was prescribed tessalon perles and albuterol inhaler, but hasn't used them because the pharmacist told her not to use them along with the other medication she was taking.  She wanted to follow up on that. Also, confused about which medications she can take together.  Improving, but still coughing. No fever, chills. No GI/GU symptoms.    Review of Systems As above.    Patient Active Problem List   Diagnosis Date Noted  . H/O: C-section 01/03/2012    Prior to Admission medications   Medication Sig Start Date End Date Taking? Authorizing Provider  cetirizine (ZYRTEC) 10 MG tablet Take 1 tablet (10 mg total) by mouth daily. 06/03/12  Yes Earl Galaenise Davies, CNM  fluticasone (FLONASE) 50 MCG/ACT nasal spray Place into both nostrils daily.   Yes Historical Provider, MD  promethazine-dextromethorphan (PROMETHAZINE-DM) 6.25-15 MG/5ML syrup  10/25/16  Yes Historical Provider, MD  benzonatate (TESSALON) 100 MG capsule  10/28/16   Historical Provider, MD  dextromethorphan (ROBITUSSIN 12 HOUR COUGH) 30 MG/5ML liquid Take by mouth as needed for cough.    Historical Provider, MD  VENTOLIN HFA 108 [phone](90 Base) MCG/ACT inhaler  10/28/16   Historical Provider, MD       No  Known Allergies     Objective:  Physical Exam  Constitutional: She is oriented to person, place, and time. She appears well-developed and well-nourished. She is active and cooperative. No distress.  BP 128/80 (BP Location: Right Arm, Patient Position: Sitting, Cuff Size: Normal)   Pulse 76   Temp 98 F (36.7 C) (Oral)   Resp 16   Ht 5\' 1"  (1.549 m)   Wt 133 lb (60.3 kg)   SpO2 98%   BMI 25.13 kg/m   HENT:  Head: Normocephalic and atraumatic.  Right Ear: Hearing normal.  Left Ear: Hearing normal.  Eyes: Conjunctivae are normal. No scleral icterus.  Neck: Normal range of motion. Neck supple. No thyromegaly present.  Cardiovascular: Normal rate, regular rhythm and normal heart sounds.   Pulses:      Radial pulses are 2+ on the right side, and 2+ on the left side.  Pulmonary/Chest: Effort normal. She has no decreased breath sounds. She has wheezes (clears with cough). She has no rhonchi. She has no rales.  Lymphadenopathy:       Head (right side): No tonsillar, no preauricular, no posterior auricular and no occipital adenopathy present.       Head (left side): No tonsillar, no preauricular, no posterior auricular and no occipital adenopathy present.    She has no cervical adenopathy.       Right: No supraclavicular adenopathy present.       Left: No supraclavicular adenopathy present.  Neurological: She is alert and oriented  to person, place, and time. No sensory deficit.  Skin: Skin is warm, dry and intact. No rash noted. No cyanosis or erythema. Nails show no clubbing.  Psychiatric: She has a normal mood and affect. Her speech is normal and behavior is normal.       Assessment & Plan:   1. Influenza due to influenza virus, type B Reviewed her medications, and advised how to take them. Encouraged her to use the tessalon perles and albuterol inhaler, continue to rest and drink lots of fluids. RTC if symptoms worsen/persist.   Fernande Bras, PA-C Physician  Assistant-Certified Primary Care at [facility]

## 2016-10-29 NOTE — Patient Instructions (Addendum)
DO take: Cetirizine (Zyrtec) 10 mg every day Flonase daily Benzonatate 1-2 every 8 hours as needed for cough Promethazine-dexromethorphan syrup at bedtime as needed for cough (OK to take every 6 hours but causes sedation) Albuterol (Ventolin) inhaler 2 puffs every 4 hours as needed for cough, shortness of breath, wheezing  Get lots of rest (delegate and defer as much as possible. Only do was is ABSOLUTELY necessary.  Drink at least 64 ounces of water daily.    IF you received an x-ray today, you will receive an invoice from [facility]. Please contact [facility] at [phone] with questions or concerns regarding your invoice.   IF you received labwork today, you will receive an invoice from [facility]. Please contact [facility] at [phone] with questions or concerns regarding your invoice.   Our billing staff will not be able to assist you with questions regarding bills from these companies.  You will be contacted with the lab results as soon as they are available. The fastest way to get your results is to activate your My Chart account. Instructions are located on the last page of this paperwork. If you have not heard from us regarding the results in 2 weeks, please contact this office.

## 2016-11-19 ENCOUNTER — Encounter: Payer: Self-pay | Admitting: Family Medicine

## 2016-11-19 ENCOUNTER — Ambulatory Visit (INDEPENDENT_AMBULATORY_CARE_PROVIDER_SITE_OTHER): Payer: Commercial Managed Care - HMO | Admitting: Family Medicine

## 2016-11-19 VITALS — BP 118/58 | HR 82 | Temp 98.1°F | Ht 61.0 in | Wt 134.6 lb

## 2016-11-19 DIAGNOSIS — Z23 Encounter for immunization: Secondary | ICD-10-CM | POA: Diagnosis not present

## 2016-11-19 DIAGNOSIS — F4322 Adjustment disorder with anxiety: Secondary | ICD-10-CM | POA: Diagnosis not present

## 2016-11-19 MED ORDER — CITALOPRAM HYDROBROMIDE 10 MG PO TABS: 10.0000 mg | ORAL_TABLET | Freq: Every day | ORAL | 11 refills | Status: DC

## 2016-11-19 NOTE — Progress Notes (Signed)
Chief Complaint  Patient presents with  . Anxiety    X 2 mth    HPI  Pt reports that her mother passed away in 09/20/16 from cancer with some sudden complications. She reports that she has been dealing with anxiety for the past 2 months. She reports that she has been having dreams about her children dying.  She reports that most nights her sleep is interrupted due to worrying tossing and turning. She states that she had to put her dog down due to illness. She reports that she feels like she is waiting for something bad to happen.  She had depression before and social anxiety and took paxil for 1-2 years in addition to counseling.   GAD 7 : Generalized Anxiety Score 11/19/2016  Nervous, Anxious, on Edge 3  Control/stop worrying 2  Worry too much - different things 3  Trouble relaxing 2  Restless 1  Easily annoyed or irritable 3  Afraid - awful might happen 3  Total GAD 7 Score 17  Anxiety Difficulty Somewhat difficult       Past Medical History:  Diagnosis Date  . Advanced maternal age in pregnancy    36  . AMA (advanced maternal age) multigravida 35+ 01/03/2012   Declined testing   . Anxiety   . GBS carrier   . H/O varicella   . H/O: C-section    2010  . Infant large for gestational age 03/12/2012   88%ile at 28 weeks. >95% at 38wks   . Mental disorder   . Normal pregnancy in multigravida 01/30/2012  . Pregnant state, incidental 05/20/2012  . Smoking history   . Urinary tract infection    1st tri    Current Outpatient Prescriptions  Medication Sig Dispense Refill  . VENTOLIN HFA 108 (90 Base) MCG/ACT inhaler     . citalopram (CELEXA) 10 MG tablet Take 1 tablet (10 mg total) by mouth daily. 30 tablet 11  . fluticasone (FLONASE) 50 MCG/ACT nasal spray Place into both nostrils daily.     No current facility-administered medications for this visit.     Allergies: No Known Allergies  Past Surgical History:  Procedure Laterality Date  . CESAREAN SECTION      . CESAREAN SECTION  05/31/2012   Procedure: CESAREAN SECTION;  Surgeon: Purcell Nails, MD;  Location: [facility];  Service: Obstetrics;  Laterality: N/A;  with Bilateral tubal ligation  . DILATION AND CURETTAGE OF UTERUS    . MANDIBLE SURGERY     age 53    Social History   Social History  . Marital status: Married    Spouse name: N/A  . Number of children: N/A  . Years of education: N/A   Social History Main Topics  . Smoking status: Never Smoker  . Smokeless tobacco: Never Used  . Alcohol use No  . Drug use: No  . Sexual activity: Not Currently    Birth control/ protection: None   Other Topics Concern  . None   Social History Narrative  . None    ROS  Objective: Vitals:   11/19/16 1414  BP: (!) 118/58  Pulse: 82  Temp: 98.1 F (36.7 C)  TempSrc: Oral  SpO2: 97%  Weight: 134 lb 9.6 oz (61.1 kg)  Height: 5\' 1"  (1.549 m)    Physical Exam  Constitutional: She is oriented to person, place, and time. She appears well-developed and well-nourished.  HENT:  Head: Normocephalic and atraumatic.  Cardiovascular: Normal rate, regular rhythm and normal  heart sounds.   No murmur heard. Pulmonary/Chest: Breath sounds normal. No respiratory distress. She has no wheezes.  Neurological: She is alert and oriented to person, place, and time.    Assessment and Plan Cheryl Sanders was seen today for anxiety.  Diagnoses and all orders for this visit:  Need for prophylactic vaccination and inoculation against influenza -     Flu Vaccine QUAD 36+ mos IM  Adjustment disorder with anxious mood Advised to add on exercise She has a plan for psychotherapy Plan for one month follow up -     citalopram (CELEXA) 10 MG tablet; Take 1 tablet (10 mg total) by mouth daily.   A total of 30 minutes were spent face-to-face with the patient during this encounter and over half of that time was spent on counseling and coordination of care.   Cheryl Sanders

## 2016-11-19 NOTE — Patient Instructions (Addendum)
     IF you received an x-ray today, you will receive an invoice from [facility]. Please contact [facility] at [phone] with questions or concerns regarding your invoice.   IF you received labwork today, you will receive an invoice from [facility]. Please contact [facility] at [phone] with questions or concerns regarding your invoice.   Our billing staff will not be able to assist you with questions regarding bills from these companies.  You will be contacted with the lab results as soon as they are available. The fastest way to get your results is to activate your My Chart account. Instructions are located on the last page of this paperwork. If you have not heard from us regarding the results in 2 weeks, please contact this office.      Adjustment Disorder Adjustment disorder is an unusually severe reaction to a stressful life event, such as the loss of a job or physical illness. The event may be any stressful event other than the loss of a loved one. Adjustment disorder may affect your feelings, your thinking, how you act, or a combination of these. It may interfere with personal relationships or with the way you are at work, school, or home. People with this disorder are at risk for suicide and substance abuse. They may develop a more serious mental disorder, such as major depressive disorder or post-traumatic stress disorder. SIGNS AND SYMPTOMS  Symptoms may include:  Sadness, depressed mood, or crying spells.  Loss of enjoyment.  Change in appetite or weight.  Sense of loss or hopelessness.  Thoughts of suicide.  Anxiety, worry, or nervousness.  Trouble sleeping.  Avoiding family and friends.  Poor school performance.  Fighting or vandalism.  Reckless driving.  Skipping school.  Poor work performance.  Ignoring bills. Symptoms of adjustment disorder start within 3 months of the stressful life event. They do not last more than 6 months after  the event has ended. DIAGNOSIS  To make a diagnosis, your health care provider will ask about what has happened in your life and how it has affected you. He or she may also ask about your medical history and use of medicines, alcohol, and other substances. Your health care provider may do a physical exam and order lab tests or other studies. You may be referred to a mental health specialist for evaluation. TREATMENT  Treatment options include:  Counseling or talk therapy. Talk therapy is usually provided by mental health specialists.  Medicine. Certain medicines may help with depression, anxiety, and sleep.  Support groups. Support groups offer emotional support, advice, and guidance. They are made up of people who have had similar experiences. HOME CARE INSTRUCTIONS  Keep all follow-up visits as directed by your health care provider. This is important.  Take medicines only as directed by your health care provider. SEEK MEDICAL CARE IF:  Your symptoms get worse.  SEEK IMMEDIATE MEDICAL CARE IF: You have serious thoughts about hurting yourself or someone else. MAKE SURE YOU:  Understand these instructions.  Will watch your condition.  Will get help right away if you are not doing well or get worse. This information is not intended to replace advice given to you by your health care provider. Make sure you discuss any questions you have with your health care provider. Document Released: 05/15/2006 Document Revised: 01/02/2016 Document Reviewed: 02/02/2014 Elsevier Interactive Patient Education  2017 Elsevier Inc.  

## 2016-12-06 ENCOUNTER — Ambulatory Visit (HOSPITAL_COMMUNITY): Payer: Self-pay | Admitting: Psychiatry

## 2016-12-18 NOTE — Progress Notes (Signed)
Chief Complaint  Patient presents with  . Follow-up    states the medication is working fine  . Anxiety    has no issues    HPI   Pt is here to follow up from office visit 11/21/16 for Adjustment Disorder with Anxiety symptoms At the last visit she was started on Celexa. She reports that when she initially started the Celexa she had some heart flutters and had an ECG and was told it was not cardiac but GERD. Since that visit she has been complaint with Celexa She reports that her sleep is better. She takes it at night. She states that she is coping better and started counseling She reports that she feels like the Celexa dose is adequate She denies side effects of Celexa  Past Medical History:  Diagnosis Date  . Advanced maternal age in pregnancy    84  . AMA (advanced maternal age) multigravida 35+ 01/03/2012   Declined testing   . Anxiety   . GBS carrier   . H/O varicella   . H/O: C-section    2010  . Infant large for gestational age 43/19/2013   88%ile at 28 weeks. >95% at 38wks   . Mental disorder   . Normal pregnancy in multigravida 01/30/2012  . Pregnant state, incidental 05/20/2012  . Smoking history   . Urinary tract infection    1st tri    Current Outpatient Prescriptions  Medication Sig Dispense Refill  . citalopram (CELEXA) 10 MG tablet Take 1 tablet (10 mg total) by mouth daily. 30 tablet 11  . fluticasone (FLONASE) 50 MCG/ACT nasal spray Place into both nostrils daily.    . VENTOLIN HFA 108 (90 Base) MCG/ACT inhaler      No current facility-administered medications for this visit.     Allergies: No Known Allergies  Past Surgical History:  Procedure Laterality Date  . CESAREAN SECTION    . CESAREAN SECTION  05/31/2012   Procedure: CESAREAN SECTION;  Surgeon: Purcell Nails, MD;  Location: [facility];  Service: Obstetrics;  Laterality: N/A;  with Bilateral tubal ligation  . DILATION AND CURETTAGE OF UTERUS    . MANDIBLE SURGERY     age 22    Social  History   Social History  . Marital status: Married    Spouse name: N/A  . Number of children: N/A  . Years of education: N/A   Social History Main Topics  . Smoking status: Never Smoker  . Smokeless tobacco: Never Used  . Alcohol use No  . Drug use: No  . Sexual activity: Not Currently    Birth control/ protection: None   Other Topics Concern  . None   Social History Narrative  . None    Review of Systems  Cardiovascular: Negative for chest pain and palpitations.  Gastrointestinal: Negative for abdominal pain, nausea and vomiting.  Skin: Negative for itching and rash.  Neurological: Negative for dizziness, tingling and headaches.   See hpi  Objective: Vitals:   12/19/16 1037  BP: 133/80  Pulse: 83  Resp: 16  Temp: 98.2 F (36.8 C)  TempSrc: Oral  SpO2: 98%  Weight: 131 lb 12.8 oz (59.8 kg)  Height: 5\' 1"  (1.549 m)   Wt Readings from Last 3 Encounters:  12/19/16 131 lb 12.8 oz (59.8 kg)  11/19/16 134 lb 9.6 oz (61.1 kg)  10/29/16 133 lb (60.3 kg)     Physical Exam  Constitutional: She is oriented to person, place, and time. She appears well-developed  and well-nourished.  HENT:  Head: Normocephalic and atraumatic.  Eyes: Conjunctivae and EOM are normal.  Cardiovascular: Normal rate, regular rhythm and normal heart sounds.   No murmur heard. Pulmonary/Chest: Effort normal and breath sounds normal. No respiratory distress. She has no wheezes.  Neurological: She is alert and oriented to person, place, and time.  Skin: Skin is warm. Capillary refill takes less than 2 seconds. No erythema.  Psychiatric: She has a normal mood and affect. Her behavior is normal. Judgment and thought content normal.    Assessment and Plan Kelton PillarCharity was seen today for follow-up and anxiety.  Diagnoses and all orders for this visit:  Adjustment disorder with anxious mood-  Advised pt to continue current dose of Celexa Continue follow up with counseling Follow up in 3  months  A total of 30 minutes were spent face-to-face with the patient during this encounter and over half of that time was spent on counseling and coordination of care.     Maddox Hlavaty A Decie Verne

## 2016-12-19 ENCOUNTER — Encounter: Payer: Self-pay | Admitting: Family Medicine

## 2016-12-19 ENCOUNTER — Ambulatory Visit (INDEPENDENT_AMBULATORY_CARE_PROVIDER_SITE_OTHER): Payer: Commercial Managed Care - HMO | Admitting: Family Medicine

## 2016-12-19 VITALS — BP 133/80 | HR 83 | Temp 98.2°F | Resp 16 | Ht 61.0 in | Wt 131.8 lb

## 2016-12-19 DIAGNOSIS — F4322 Adjustment disorder with anxiety: Secondary | ICD-10-CM | POA: Diagnosis not present

## 2016-12-19 NOTE — Patient Instructions (Addendum)
     IF you received an x-ray today, you will receive an invoice from [facility]. Please contact [facility] at [phone] with questions or concerns regarding your invoice.   IF you received labwork today, you will receive an invoice from [facility]. Please contact [facility] at [phone] with questions or concerns regarding your invoice.   Our billing staff will not be able to assist you with questions regarding bills from these companies.  You will be contacted with the lab results as soon as they are available. The fastest way to get your results is to activate your My Chart account. Instructions are located on the last page of this paperwork. If you have not heard from us regarding the results in 2 weeks, please contact this office.      Adjustment Disorder Adjustment disorder is an unusually severe reaction to a stressful life event, such as the loss of a job or physical illness. The event may be any stressful event other than the loss of a loved one. Adjustment disorder may affect your feelings, your thinking, how you act, or a combination of these. It may interfere with personal relationships or with the way you are at work, school, or home. People with this disorder are at risk for suicide and substance abuse. They may develop a more serious mental disorder, such as major depressive disorder or post-traumatic stress disorder. SIGNS AND SYMPTOMS  Symptoms may include:  Sadness, depressed mood, or crying spells.  Loss of enjoyment.  Change in appetite or weight.  Sense of loss or hopelessness.  Thoughts of suicide.  Anxiety, worry, or nervousness.  Trouble sleeping.  Avoiding family and friends.  Poor school performance.  Fighting or vandalism.  Reckless driving.  Skipping school.  Poor work International aid/development workerperformance.  Ignoring bills. Symptoms of adjustment disorder start within 3 months of the stressful life event. They do not last more than 6 months after  the event has ended. DIAGNOSIS  To make a diagnosis, your health care provider will ask about what has happened in your life and how it has affected you. He or she may also ask about your medical history and use of medicines, alcohol, and other substances. Your health care provider may do a physical exam and order lab tests or other studies. You may be referred to a mental health specialist for evaluation. TREATMENT  Treatment options include:  Counseling or talk therapy. Talk therapy is usually provided by mental health specialists.  Medicine. Certain medicines may help with depression, anxiety, and sleep.  Support groups. Support groups offer emotional support, advice, and guidance. They are made up of people who have had similar experiences. HOME CARE INSTRUCTIONS  Keep all follow-up visits as directed by your health care provider. This is important.  Take medicines only as directed by your health care provider. SEEK MEDICAL CARE IF:  Your symptoms get worse.  SEEK IMMEDIATE MEDICAL CARE IF: You have serious thoughts about hurting yourself or someone else. MAKE SURE YOU:  Understand these instructions.  Will watch your condition.  Will get help right away if you are not doing well or get worse. This information is not intended to replace advice given to you by your health care provider. Make sure you discuss any questions you have with your health care provider. Document Released: 05/15/2006 Document Revised: 01/02/2016 Document Reviewed: 02/02/2014 Elsevier Interactive Patient Education  2017 [employer]

## 2017-03-20 ENCOUNTER — Encounter: Payer: Self-pay | Admitting: Family Medicine

## 2017-03-20 ENCOUNTER — Ambulatory Visit (INDEPENDENT_AMBULATORY_CARE_PROVIDER_SITE_OTHER): Payer: 59 | Admitting: Family Medicine

## 2017-03-20 VITALS — BP 137/83 | HR 75 | Temp 98.0°F | Resp 16 | Ht 61.0 in | Wt 135.0 lb

## 2017-03-20 DIAGNOSIS — F410 Panic disorder [episodic paroxysmal anxiety] without agoraphobia: Secondary | ICD-10-CM | POA: Diagnosis not present

## 2017-03-20 DIAGNOSIS — F4322 Adjustment disorder with anxiety: Secondary | ICD-10-CM | POA: Diagnosis not present

## 2017-03-20 MED ORDER — HYDROXYZINE HCL 10 MG PO TABS: 10.0000 mg | ORAL_TABLET | Freq: Three times a day (TID) | ORAL | 0 refills | Status: DC | PRN

## 2017-03-20 NOTE — Progress Notes (Signed)
Chief Complaint  Patient presents with  . med check    followup    HPI   Pt reports that she has been having goo response to the Celexa and is doing well at work She reports that she has a cat now which is triggering panic attacks and constant worry about rabies.  She states that she has calmed down about it.    Past Medical History:  Diagnosis Date  . Advanced maternal age in pregnancy    [age]  . AMA (advanced maternal age) multigravida 35+ 01/03/2012   Declined testing   . Anxiety   . GBS carrier   . H/O varicella   . H/O: C-section    2010  . Infant large for gestational age 10/13/2011   88%ile at 28 weeks. >95% at 38wks   . Mental disorder   . Normal pregnancy in multigravida 01/30/2012  . Pregnant state, incidental 05/20/2012  . Smoking history   . Urinary tract infection    1st tri    Current Outpatient Prescriptions  Medication Sig Dispense Refill  . cetirizine (ZYRTEC) 10 MG tablet Take 10 mg by mouth daily.    . citalopram (CELEXA) 10 MG tablet Take 1 tablet (10 mg total) by mouth daily. 30 tablet 11  . fluticasone (FLONASE) 50 MCG/ACT nasal spray Place into both nostrils daily.    . ranitidine (ZANTAC) 150 MG tablet Take 150 mg by mouth once.    . hydrOXYzine (ATARAX/VISTARIL) 10 MG tablet Take 1 tablet (10 mg total) by mouth 3 (three) times daily as needed for anxiety. 30 tablet 0  . VENTOLIN HFA 108 (90 Base) MCG/ACT inhaler      No current facility-administered medications for this visit.     Allergies: No Known Allergies  Past Surgical History:  Procedure Laterality Date  . CESAREAN SECTION    . CESAREAN SECTION  05/31/2012   Procedure: CESAREAN SECTION;  Surgeon: Purcell NailsAngela Y Roberts, MD;  Location: [facility];  Service: Obstetrics;  Laterality: N/A;  with Bilateral tubal ligation  . DILATION AND CURETTAGE OF UTERUS    . MANDIBLE SURGERY     age 89    Social History   Social History  . Marital status: Married    Spouse name: N/A  . Number of children: N/A   . Years of education: N/A   Social History Main Topics  . Smoking status: Current Every Day Smoker    Packs/day: 0.50    Years: 18.00    Types: Cigarettes  . Smokeless tobacco: Never Used  . Alcohol use No  . Drug use: No  . Sexual activity: Not Currently    Birth control/ protection: None   Other Topics Concern  . None   Social History Narrative  . None    ROS Review of Systems See HPI Constitution: No fevers or chills No malaise No diaphoresis Skin: No rash or itching Eyes: no blurry vision, no double vision GU: no dysuria or hematuria Neuro: no dizziness or headaches  Objective: Vitals:   03/20/17 1055  BP: 137/83  Pulse: 75  Resp: 16  Temp: 98 F (36.7 C)  TempSrc: Oral  SpO2: 97%  Weight: 135 lb (61.2 kg)  Height: 5\' 1"  (1.549 m)    Physical Exam  Constitutional: She is oriented to person, place, and time. She appears well-developed and well-nourished.  HENT:  Head: Normocephalic and atraumatic.  Eyes: Conjunctivae and EOM are normal.  Cardiovascular: Normal rate, regular rhythm and normal heart sounds.  Pulmonary/Chest: Effort normal and breath sounds normal. No respiratory distress. She has no wheezes.  Neurological: She is alert and oriented to person, place, and time.  Skin: Capillary refill takes less than 2 seconds.  Psychiatric: She has a normal mood and affect. Her behavior is normal. Judgment and thought content normal.      Assessment and Plan Adanna was seen today for med check.  Diagnoses and all orders for this visit:  Adjustment disorder with anxious mood Panic attacks   Continue current celexa  Atarax for panic attacks  Other orders -     hydrOXYzine (ATARAX/VISTARIL) 10 MG tablet; Take 1 tablet (10 mg total) by mouth 3 (three) times daily as needed for anxiety.     Delmus Warwick A Alaina Donati

## 2017-03-20 NOTE — Patient Instructions (Addendum)
   IF you received an x-ray today, you will receive an invoice from [facility]. Please contact  Radiology at [phone] with questions or concerns regarding your invoice.   IF you received labwork today, you will receive an invoice from [facility]. Please contact [facility] at [phone] with questions or concerns regarding your invoice.   Our billing staff will not be able to assist you with questions regarding bills from these companies.  You will be contacted with the lab results as soon as they are available. The fastest way to get your results is to activate your My Chart account. Instructions are located on the last page of this paperwork. If you have not heard from us regarding the results in 2 weeks, please contact this office.    Panic Attacks Panic attacks are sudden, short-livedsurges of severe anxiety, fear, or discomfort. They may occur for no reason when you are relaxed, when you are anxious, or when you are sleeping. Panic attacks may occur for a number of reasons:  Healthy people occasionally have panic attacks in extreme, life-threatening situations, such as war or natural disasters. Normal anxiety is a protective mechanism of the body that helps us react to danger (fight or flight response).  Panic attacks are often seen with anxiety disorders, such as panic disorder, social anxiety disorder, generalized anxiety disorder, and phobias. Anxiety disorders cause excessive or uncontrollable anxiety. They may interfere with your relationships or other life activities.  Panic attacks are sometimes seen with other mental illnesses, such as depression and posttraumatic stress disorder.  Certain medical conditions, prescription medicines, and drugs of abuse can cause panic attacks. What are the signs or symptoms? Panic attacks start suddenly, peak within 20 minutes, and are accompanied by four or more of the following symptoms:  Pounding heart or fast heart  rate (palpitations).  Sweating.  Trembling or shaking.  Shortness of breath or feeling smothered.  Feeling choked.  Chest pain or discomfort.  Nausea or strange feeling in your stomach.  Dizziness, light-headedness, or feeling like you will faint.  Chills or hot flushes.  Numbness or tingling in your lips or hands and feet.  Feeling that things are not real or feeling that you are not yourself.  Fear of losing control or going crazy.  Fear of dying. Some of these symptoms can mimic serious medical conditions. For example, you may think you are having a heart attack. Although panic attacks can be very scary, they are not life threatening. How is this diagnosed? Panic attacks are diagnosed through an assessment by your health care provider. Your health care provider will ask questions about your symptoms, such as where and when they occurred. Your health care provider will also ask about your medical history and use of alcohol and drugs, including prescription medicines. Your health care provider may order blood tests or other studies to rule out a serious medical condition. Your health care provider may refer you to a mental health professional for further evaluation. How is this treated?  Most healthy people who have one or two panic attacks in an extreme, life-threatening situation will not require treatment.  The treatment for panic attacks associated with anxiety disorders or other mental illness typically involves counseling with a mental health professional, medicine, or a combination of both. Your health care provider will help determine what treatment is best for you.  Panic attacks due to physical illness usually go away with treatment of the illness. If prescription medicine is causing panic attacks, talk   with your health care provider about stopping the medicine, decreasing the dose, or substituting another medicine.  Panic attacks due to alcohol or drug abuse go away  with abstinence. Some adults need professional help in order to stop drinking or using drugs. Follow these instructions at home:  Take all medicines as directed by your health care provider.  Schedule and attend follow-up visits as directed by your health care provider. It is important to keep all your appointments. Contact a health care provider if:  You are not able to take your medicines as prescribed.  Your symptoms do not improve or get worse. Get help right away if:  You experience panic attack symptoms that are different than your usual symptoms.  You have serious thoughts about hurting yourself or others.  You are taking medicine for panic attacks and have a serious side effect. This information is not intended to replace advice given to you by your health care provider. Make sure you discuss any questions you have with your health care provider. Document Released: 09/10/2005 Document Revised: 02/16/2016 Document Reviewed: 04/24/2013 Elsevier Interactive Patient Education  2017 Elsevier Inc.  

## 2017-04-29 ENCOUNTER — Other Ambulatory Visit: Payer: Self-pay | Admitting: Family Medicine

## 2017-06-19 ENCOUNTER — Ambulatory Visit: Payer: 59 | Admitting: Family Medicine

## 2017-06-19 NOTE — Progress Notes (Deleted)
  No chief complaint on file.   HPI  Past Medical History:  Diagnosis Date  . Advanced maternal age in pregnancy    43  . AMA (advanced maternal age) multigravida 41+ 01/03/2012   Declined testing   . Anxiety   . GBS carrier   . H/O varicella   . H/O: C-section    2010  . Infant large for gestational age 41/19/2013   88%ile at 28 weeks. >95% at 38wks   . Mental disorder   . Normal pregnancy in multigravida 01/30/2012  . Pregnant state, incidental 05/20/2012  . Smoking history   . Urinary tract infection    1st tri    Current Outpatient Prescriptions  Medication Sig Dispense Refill  . cetirizine (ZYRTEC) 10 MG tablet Take 10 mg by mouth daily.    . citalopram (CELEXA) 10 MG tablet Take 1 tablet (10 mg total) by mouth daily. 30 tablet 11  . fluticasone (FLONASE) 50 MCG/ACT nasal spray Place into both nostrils daily.    . hydrOXYzine (ATARAX/VISTARIL) 10 MG tablet Take 1 tablet (10 mg total) by mouth 3 (three) times daily as needed for anxiety. 30 tablet 0  . ranitidine (ZANTAC) 150 MG tablet Take 150 mg by mouth once.    . VENTOLIN HFA 108 (90 Base) MCG/ACT inhaler      No current facility-administered medications for this visit.     Allergies: No Known Allergies  Past Surgical History:  Procedure Laterality Date  . CESAREAN SECTION    . CESAREAN SECTION  05/31/2012   Procedure: CESAREAN SECTION;  Surgeon: Purcell Nails, MD;  Location: [facility];  Service: Obstetrics;  Laterality: N/A;  with Bilateral tubal ligation  . DILATION AND CURETTAGE OF UTERUS    . MANDIBLE SURGERY     age 41    Social History   Social History  . Marital status: Married    Spouse name: N/A  . Number of children: N/A  . Years of education: N/A   Social History Main Topics  . Smoking status: Current Every Day Smoker    Packs/day: 0.50    Years: 18.00    Types: Cigarettes  . Smokeless tobacco: Never Used  . Alcohol use No  . Drug use: No  . Sexual activity: Not Currently    Birth  control/ protection: None   Other Topics Concern  . Not on file   Social History Narrative  . No narrative on file    ROS  Objective: There were no vitals filed for this visit.  Physical Exam  Assessment and Plan There are no diagnoses linked to this encounter.   Skyah Hannon P [employer]

## 2017-09-25 NOTE — Progress Notes (Signed)
Chief Complaint  Patient presents with  . Medication Refill     Celexa, pt has already received flu and tdap vaccines     HPI   Pt reports that she is doing well with the Celexa  She has not been taking it because she switched insurance and jobs She weaned herself off of it but she would like to get back on it She was doing much better on it She stats that the job transition has been stressful for her She is still in [employer] but at a [employer] So the job has more responsibility  BP Readings from Last 3 Encounters:  09/26/17 (!) 145/85  03/20/17 137/83  12/19/16 133/80   No LMP recorded. Patient is not currently having periods (Reason: IUD).  [facility] 09/26/2017 03/20/2017 12/19/2016 11/19/2016 10/29/2016  Decreased Interest 0 0 0 1 0  Down, Depressed, Hopeless 0 0 0 1 0  PHQ - 2 Score 0 0 0 2 0  Altered sleeping - - - 2 -  Tired, decreased energy - - - 2 -  Change in appetite - - - 2 -  Feeling bad or failure about yourself  - - - 1 -  Trouble concentrating - - - 2 -  Moving slowly or fidgety/restless - - - 2 -  Suicidal thoughts - - - 0 -  PHQ-9 Score - - - 13 -  Difficult doing work/chores - - - Somewhat difficult -    Pre-hypertension- new problem BP Readings from Last 3 Encounters:  09/26/17 133/84  03/20/17 137/83  12/19/16 133/80   Pt reports that the new job is stressful She is mindful of what she eats Not much exercise She has gained some weight with the new job No chest pains or palpitations Wt Readings from Last 3 Encounters:  09/26/17 152 lb 12.8 oz (69.3 kg)  03/20/17 135 lb (61.2 kg)  12/19/16 131 lb 12.8 oz (59.8 kg)     Past Medical History:  Diagnosis Date  . Advanced maternal age in pregnancy    [age]  . AMA (advanced maternal age) multigravida 35+ 01/03/2012   Declined testing   . Anxiety   . GBS carrier   . H/O varicella   . H/O: C-section    2010  . Infant large for gestational age 23/19/2013   88%ile at  28 weeks. >95% at 38wks   . Mental disorder   . Normal pregnancy in multigravida 01/30/2012  . Pregnant state, incidental 05/20/2012  . Smoking history   . Urinary tract infection    1st tri    Current Outpatient Medications  Medication Sig Dispense Refill  . cetirizine (ZYRTEC) 10 MG tablet Take 10 mg by mouth daily.    . citalopram (CELEXA) 10 MG tablet Take 1 tablet (10 mg total) by mouth daily. 30 tablet 11  . fluticasone (FLONASE) 50 MCG/ACT nasal spray Place into both nostrils daily.    . hydrOXYzine (ATARAX/VISTARIL) 10 MG tablet Take 1 tablet (10 mg total) by mouth 3 (three) times daily as needed for anxiety. 30 tablet 0  . ranitidine (ZANTAC) 150 MG tablet Take 150 mg by mouth once.    . VENTOLIN HFA 108 (90 Base) MCG/ACT inhaler      No current facility-administered medications for this visit.     Allergies: No Known Allergies  Past Surgical History:  Procedure Laterality Date  . CESAREAN SECTION    . CESAREAN SECTION  05/31/2012   Procedure: CESAREAN  SECTION;  Surgeon: Purcell Nails, MD;  Location: [facility];  Service: Obstetrics;  Laterality: N/A;  with Bilateral tubal ligation  . DILATION AND CURETTAGE OF UTERUS    . MANDIBLE SURGERY     age 67    Social History   Socioeconomic History  . Marital status: Married    Spouse name: None  . Number of children: None  . Years of education: None  . Highest education level: None  Social Needs  . Financial resource strain: None  . Food insecurity - worry: None  . Food insecurity - inability: None  . Transportation needs - medical: None  . Transportation needs - non-medical: None  Occupational History  . None  Tobacco Use  . Smoking status: Current Every Day Smoker    Packs/day: 0.50    Years: 18.00    Pack years: 9.00    Types: Cigarettes  . Smokeless tobacco: Current User  Substance and Sexual Activity  . Alcohol use: No  . Drug use: No  . Sexual activity: Not Currently    Birth control/protection: None    Other Topics Concern  . None  Social History Narrative  . None    Family History  Problem Relation Age of Onset  . Graves' disease Mother   . Hypertension Father   . Mental illness Father        panic attacks  . Hypertension Sister   . Kidney disease Sister   . Hypothyroidism Maternal Grandmother   . Stroke Maternal Grandmother   . Arthritis Maternal Grandmother        RA  . COPD Maternal Grandfather        emphysema  . Parkinsonism Paternal Grandmother   . Alzheimer's disease Paternal Grandfather   . Other Neg Hx      ROS Review of Systems See HPI Constitution: No fevers or chills No malaise No diaphoresis Skin: No rash or itching Eyes: no blurry vision, no double vision GU: no dysuria or hematuria Neuro: no dizziness or headaches all others reviewed and negative   Objective: Vitals:   09/26/17 0829  BP: (!) 145/85  Pulse: 89  Resp: 16  Temp: 98.5 F (36.9 C)  TempSrc: Oral  SpO2: 100%  Weight: 152 lb 12.8 oz (69.3 kg)  Height: 5\' 1"  (1.549 m)    Physical Exam  Constitutional: She is oriented to person, place, and time. She appears well-developed and well-nourished.  HENT:  Head: Normocephalic and atraumatic.  Eyes: Conjunctivae and EOM are normal.  Cardiovascular: Normal rate, regular rhythm and normal heart sounds.  No murmur heard. Pulmonary/Chest: Effort normal and breath sounds normal. No stridor. No respiratory distress. She has no wheezes.  Neurological: She is alert and oriented to person, place, and time.  Skin: Skin is warm. Capillary refill takes less than 2 seconds.  Psychiatric: She has a normal mood and affect. Her behavior is normal. Judgment and thought content normal.     Assessment and Plan Cheryl Sanders was seen today for medication refill.  Diagnoses and all orders for this visit:  Adjustment disorder with anxious mood: refilled Celexa today  Pre-hypertension: discussed DASH diet and also discussed stress mgmt  Encounter for  medication monitoring: discussed her hydroxyzine as well as resuming Celexa  Cheryl Sanders

## 2017-09-26 ENCOUNTER — Other Ambulatory Visit: Payer: Self-pay

## 2017-09-26 ENCOUNTER — Ambulatory Visit: Payer: 59 | Admitting: Family Medicine

## 2017-09-26 ENCOUNTER — Encounter: Payer: Self-pay | Admitting: Family Medicine

## 2017-09-26 VITALS — BP 133/84 | HR 67 | Temp 98.5°F | Resp 16 | Ht 61.0 in | Wt 152.8 lb

## 2017-09-26 DIAGNOSIS — F4322 Adjustment disorder with anxiety: Secondary | ICD-10-CM | POA: Diagnosis not present

## 2017-09-26 DIAGNOSIS — R030 Elevated blood-pressure reading, without diagnosis of hypertension: Secondary | ICD-10-CM | POA: Insufficient documentation

## 2017-09-26 DIAGNOSIS — Z5181 Encounter for therapeutic drug level monitoring: Secondary | ICD-10-CM

## 2017-09-26 MED ORDER — CITALOPRAM HYDROBROMIDE 10 MG PO TABS: 10.0000 mg | ORAL_TABLET | Freq: Every day | ORAL | 1 refills | Status: DC

## 2017-09-26 MED ORDER — HYDROXYZINE HCL 10 MG PO TABS: 10.0000 mg | ORAL_TABLET | Freq: Three times a day (TID) | ORAL | 0 refills | Status: AC | PRN

## 2017-09-26 NOTE — Patient Instructions (Addendum)
   IF you received an x-ray today, you will receive an invoice from [facility]. Please contact [facility] at [phone] with questions or concerns regarding your invoice.   IF you received labwork today, you will receive an invoice from [facility]. Please contact [facility] at [phone] with questions or concerns regarding your invoice.   Our billing staff will not be able to assist you with questions regarding bills from these companies.  You will be contacted with the lab results as soon as they are available. The fastest way to get your results is to activate your My Chart account. Instructions are located on the last page of this paperwork. If you have not heard from us regarding the results in 2 weeks, please contact this office.     Preventing Hypertension Hypertension, commonly called high blood pressure, is when the force of blood pumping through the arteries is too strong. Arteries are blood vessels that carry blood from the heart throughout the body. Over time, hypertension can damage the arteries and decrease blood flow to important parts of the body, including the brain, heart, and kidneys. Often, hypertension does not cause symptoms until blood pressure is very high. For this reason, it is important to have your blood pressure checked on a regular basis. Hypertension can often be prevented with diet and lifestyle changes. If you already have hypertension, you can control it with diet and lifestyle changes, as well as medicine. What nutrition changes can be made? Maintain a healthy diet. This includes:  Eating less salt (sodium). Ask your health care provider how much sodium is safe for you to have. The general recommendation is to consume less than 1 tsp (2,300 mg) of sodium a day. ? Do not add salt to your food. ? Choose low-sodium options when grocery shopping and eating out.  Limiting fats in your diet. You can do this by eating low-fat or fat-free dairy  products and by eating less red meat.  Eating more fruits, vegetables, and whole grains. Make a goal to eat: ? 1-2 cups of fresh fruits and vegetables each day. ? 3-4 servings of whole grains each day.  Avoiding foods and beverages that have added sugars.  Eating fish that contain healthy fats (omega-3 fatty acids), such as mackerel or salmon.  If you need help putting together a healthy eating plan, try the DASH diet. This diet is high in fruits, vegetables, and whole grains. It is low in sodium, red meat, and added sugars. DASH stands for Dietary Approaches to Stop Hypertension. What lifestyle changes can be made?  Lose weight if you are overweight. Losing just 3?5% of your body weight can help prevent or control hypertension. ? For example, if your present weight is 200 lb (91 kg), a loss of 3-5% of your weight means losing 6-10 lb (2.7-4.5 kg). ? Ask your health care provider to help you with a diet and exercise plan to safely lose weight.  Get enough exercise. Do at least 150 minutes of moderate-intensity exercise each week. ? You could do this in short exercise sessions several times a day, or you could do longer exercise sessions a few times a week. For example, you could take a brisk 10-minute walk or bike ride, 3 times a day, for 5 days a week.  Find ways to reduce stress, such as exercising, meditating, listening to music, or taking a yoga class. If you need help reducing stress, ask your health care provider.  Do not smoke. This includes   e-cigarettes. Chemicals in tobacco and nicotine products raise your blood pressure each time you smoke. If you need help quitting, ask your health care provider.  Avoid alcohol. If you drink alcohol, limit alcohol intake to no more than 1 drink a day for nonpregnant women and 2 drinks a day for men. One drink equals 12 oz of beer, 5 oz of wine, or 1 oz of hard liquor. Why are these changes important? Diet and lifestyle changes can help you  prevent hypertension, and they may make you feel better overall and improve your quality of life. If you have hypertension, making these changes will help you control it and help prevent major complications, such as:  Hardening and narrowing of arteries that supply blood to: ? Your heart. This can cause a heart attack. ? Your brain. This can cause a stroke. ? Your kidneys. This can cause kidney failure.  Stress on your heart muscle, which can cause heart failure.  What can I do to lower my risk?  Work with your health care provider to make a hypertension prevention plan that works for you. Follow your plan and keep all follow-up visits as told by your health care provider.  Learn how to check your blood pressure at home. Make sure that you know your personal target blood pressure, as told by your health care provider. How is this treated? In addition to diet and lifestyle changes, your health care provider may recommend medicines to help lower your blood pressure. You may need to try a few different medicines to find what works best for you. You also may need to take more than one medicine. Take over-the-counter and prescription medicines only as told by your health care provider. Where to find support: Your health care provider can help you prevent hypertension and help you keep your blood pressure at a healthy level. Your local hospital or your community may also provide support services and prevention programs. The American Heart Association offers an online support network at: http://supportnetwork.heart.org/high-blood-pressure Where to find more information: Learn more about hypertension from:  National Heart, Lung, and Blood Institute: www.nhlbi.nih.gov/health/health-topics/topics/hbp  Centers for Disease Control and Prevention: www.cdc.gov/bloodpressure  American Academy of Family Physicians:  http://familydoctor.org/familydoctor/en/diseases-conditions/high-blood-pressure.printerview.all.html  Learn more about the DASH diet from:  National Heart, Lung, and Blood Institute: www.nhlbi.nih.gov/health/health-topics/topics/dash  Contact a health care provider if:  You think you are having a reaction to medicines you have taken.  You have recurrent headaches or feel dizzy.  You have swelling in your ankles.  You have trouble with your vision. Summary  Hypertension often does not cause any symptoms until blood pressure is very high. It is important to get your blood pressure checked regularly.  Diet and lifestyle changes are the most important steps in preventing hypertension.  By keeping your blood pressure in a healthy range, you can prevent complications like heart attack, heart failure, stroke, and kidney failure.  Work with your health care provider to make a hypertension prevention plan that works for you. This information is not intended to replace advice given to you by your health care provider. Make sure you discuss any questions you have with your health care provider. Document Released: 09/25/2015 Document Revised: 05/21/2016 Document Reviewed: 05/21/2016 Elsevier Interactive Patient Education  2018 Elsevier Inc.  

## 2017-12-18 ENCOUNTER — Other Ambulatory Visit: Payer: Self-pay | Admitting: Family Medicine

## 2017-12-25 ENCOUNTER — Encounter: Payer: 59 | Admitting: Family Medicine

## 2017-12-30 ENCOUNTER — Encounter: Payer: 59 | Admitting: Family Medicine

## 2018-01-06 ENCOUNTER — Encounter: Payer: Self-pay | Admitting: Family Medicine

## 2018-01-06 ENCOUNTER — Ambulatory Visit (INDEPENDENT_AMBULATORY_CARE_PROVIDER_SITE_OTHER): Payer: 59 | Admitting: Family Medicine

## 2018-01-06 ENCOUNTER — Other Ambulatory Visit: Payer: Self-pay

## 2018-01-06 VITALS — BP 120/60 | HR 86 | Temp 97.7°F | Ht 61.0 in | Wt 155.0 lb

## 2018-01-06 DIAGNOSIS — Z131 Encounter for screening for diabetes mellitus: Secondary | ICD-10-CM

## 2018-01-06 DIAGNOSIS — F4322 Adjustment disorder with anxiety: Secondary | ICD-10-CM | POA: Diagnosis not present

## 2018-01-06 DIAGNOSIS — Z0000 Encounter for general adult medical examination without abnormal findings: Secondary | ICD-10-CM

## 2018-01-06 DIAGNOSIS — Z13220 Encounter for screening for lipoid disorders: Secondary | ICD-10-CM | POA: Diagnosis not present

## 2018-01-06 MED ORDER — CITALOPRAM HYDROBROMIDE 10 MG PO TABS: 10.0000 mg | ORAL_TABLET | Freq: Every day | ORAL | 3 refills | Status: AC

## 2018-01-06 NOTE — Patient Instructions (Addendum)
   IF you received an x-ray today, you will receive an invoice from [facility]. Please contact [facility] at [phone] with questions or concerns regarding your invoice.   IF you received labwork today, you will receive an invoice from [facility]. Please contact [facility] at [phone] with questions or concerns regarding your invoice.   Our billing staff will not be able to assist you with questions regarding bills from these companies.  You will be contacted with the lab results as soon as they are available. The fastest way to get your results is to activate your My Chart account. Instructions are located on the last page of this paperwork. If you have not heard from us regarding the results in 2 weeks, please contact this office.    Health Maintenance, Female Adopting a healthy lifestyle and getting preventive care can go a long way to promote health and wellness. Talk with your health care provider about what schedule of regular examinations is right for you. This is a good chance for you to check in with your provider about disease prevention and staying healthy. In between checkups, there are plenty of things you can do on your own. Experts have done a lot of research about which lifestyle changes and preventive measures are most likely to keep you healthy. Ask your health care provider for more information. Weight and diet Eat a healthy diet  Be sure to include plenty of vegetables, fruits, low-fat dairy products, and lean protein.  Do not eat a lot of foods high in solid fats, added sugars, or salt.  Get regular exercise. This is one of the most important things you can do for your health. ? Most adults should exercise for at least 150 minutes each week. The exercise should increase your heart rate and make you sweat (moderate-intensity exercise). ? Most adults should also do strengthening exercises at least twice a week. This is in addition to the  moderate-intensity exercise.  Maintain a healthy weight  Body mass index (BMI) is a measurement that can be used to identify possible weight problems. It estimates body fat based on height and weight. Your health care provider can help determine your BMI and help you achieve or maintain a healthy weight.  For females 20 years of age and older: ? A BMI below 18.5 is considered underweight. ? A BMI of 18.5 to 24.9 is normal. ? A BMI of 25 to 29.9 is considered overweight. ? A BMI of 30 and above is considered obese.  Watch levels of cholesterol and blood lipids  You should start having your blood tested for lipids and cholesterol at 42 years of age, then have this test every 5 years.  You may need to have your cholesterol levels checked more often if: ? Your lipid or cholesterol levels are high. ? You are older than 42 years of age. ? You are at high risk for heart disease.  Cancer screening Lung Cancer  Lung cancer screening is recommended for adults 55-80 years old who are at high risk for lung cancer because of a history of smoking.  A yearly low-dose CT scan of the lungs is recommended for people who: ? Currently smoke. ? Have quit within the past 15 years. ? Have at least a 30-pack-year history of smoking. A pack year is smoking an average of one pack of cigarettes a day for 1 year.  Yearly screening should continue until it has been 15 years since you quit.  Yearly screening   should stop if you develop a health problem that would prevent you from having lung cancer treatment.  Breast Cancer  Practice breast self-awareness. This means understanding how your breasts normally appear and feel.  It also means doing regular breast self-exams. Let your health care provider know about any changes, no matter how small.  If you are in your 20s or 30s, you should have a clinical breast exam (CBE) by a health care provider every 1-3 years as part of a regular health exam.  If you  are 42 or older, have a CBE every year. Also consider having a breast X-ray (mammogram) every year.  If you have a family history of breast cancer, talk to your health care provider about genetic screening.  If you are at high risk for breast cancer, talk to your health care provider about having an MRI and a mammogram every year.  Breast cancer gene (BRCA) assessment is recommended for women who have family members with BRCA-related cancers. BRCA-related cancers include: ? Breast. ? Ovarian. ? Tubal. ? Peritoneal cancers.  Results of the assessment will determine the need for genetic counseling and BRCA1 and BRCA2 testing.  Cervical Cancer Your health care provider may recommend that you be screened regularly for cancer of the pelvic organs (ovaries, uterus, and vagina). This screening involves a pelvic examination, including checking for microscopic changes to the surface of your cervix (Pap test). You may be encouraged to have this screening done every 3 years, beginning at age 56.  For women ages 41-65, health care providers may recommend pelvic exams and Pap testing every 3 years, or they may recommend the Pap and pelvic exam, combined with testing for human papilloma virus (HPV), every 5 years. Some types of HPV increase your risk of cervical cancer. Testing for HPV may also be done on women of any age with unclear Pap test results.  Other health care providers may not recommend any screening for nonpregnant women who are considered low risk for pelvic cancer and who do not have symptoms. Ask your health care provider if a screening pelvic exam is right for you.  If you have had past treatment for cervical cancer or a condition that could lead to cancer, you need Pap tests and screening for cancer for at least 20 years after your treatment. If Pap tests have been discontinued, your risk factors (such as having a new sexual partner) need to be reassessed to determine if screening should  resume. Some women have medical problems that increase the chance of getting cervical cancer. In these cases, your health care provider may recommend more frequent screening and Pap tests.  Colorectal Cancer  This type of cancer can be detected and often prevented.  Routine colorectal cancer screening usually begins at 42 years of age and continues through 42 years of age.  Your health care provider may recommend screening at an earlier age if you have risk factors for colon cancer.  Your health care provider may also recommend using home test kits to check for hidden blood in the stool.  A small camera at the end of a tube can be used to examine your colon directly (sigmoidoscopy or colonoscopy). This is done to check for the earliest forms of colorectal cancer.  Routine screening usually begins at age 16.  Direct examination of the colon should be repeated every 5-10 years through 42 years of age. However, you may need to be screened more often if early forms of precancerous polyps or  small growths are found.  Skin Cancer  Check your skin from head to toe regularly.  Tell your health care provider about any new moles or changes in moles, especially if there is a change in a mole's shape or color.  Also tell your health care provider if you have a mole that is larger than the size of a pencil eraser.  Always use sunscreen. Apply sunscreen liberally and repeatedly throughout the day.  Protect yourself by wearing long sleeves, pants, a wide-brimmed hat, and sunglasses whenever you are outside.  Heart disease, diabetes, and high blood pressure  High blood pressure causes heart disease and increases the risk of stroke. High blood pressure is more likely to develop in: ? People who have blood pressure in the high end of the normal range (130-139/85-89 mm Hg). ? People who are overweight or obese. ? People who are African American.  If you are 61-36 years of age, have your blood  pressure checked every 3-5 years. If you are 6 years of age or older, have your blood pressure checked every year. You should have your blood pressure measured twice-once when you are at a hospital or clinic, and once when you are not at a hospital or clinic. Record the average of the two measurements. To check your blood pressure when you are not at a hospital or clinic, you can use: ? An automated blood pressure machine at a pharmacy. ? A home blood pressure monitor.  If you are between 7 years and 29 years old, ask your health care provider if you should take aspirin to prevent strokes.  Have regular diabetes screenings. This involves taking a blood sample to check your fasting blood sugar level. ? If you are at a normal weight and have a low risk for diabetes, have this test once every three years after 42 years of age. ? If you are overweight and have a high risk for diabetes, consider being tested at a younger age or more often. Preventing infection Hepatitis B  If you have a higher risk for hepatitis B, you should be screened for this virus. You are considered at high risk for hepatitis B if: ? You were born in a country where hepatitis B is common. Ask your health care provider which countries are considered high risk. ? Your parents were born in a high-risk country, and you have not been immunized against hepatitis B (hepatitis B vaccine). ? You have HIV or AIDS. ? You use needles to inject street drugs. ? You live with someone who has hepatitis B. ? You have had sex with someone who has hepatitis B. ? You get hemodialysis treatment. ? You take certain medicines for conditions, including cancer, organ transplantation, and autoimmune conditions.  Hepatitis C  Blood testing is recommended for: ? Everyone born from [birth year] through 1965. ? Anyone with known risk factors for hepatitis C.  Sexually transmitted infections (STIs)  You should be screened for sexually transmitted  infections (STIs) including gonorrhea and chlamydia if: ? You are sexually active and are younger than 42 years of age. ? You are older than 42 years of age and your health care provider tells you that you are at risk for this type of infection. ? Your sexual activity has changed since you were last screened and you are at an increased risk for chlamydia or gonorrhea. Ask your health care provider if you are at risk.  If you do not have HIV, but are at risk, it  may be recommended that you take a prescription medicine daily to prevent HIV infection. This is called pre-exposure prophylaxis (PrEP). You are considered at risk if: ? You are sexually active and do not regularly use condoms or know the HIV status of your partner(s). ? You take drugs by injection. ? You are sexually active with a partner who has HIV.  Talk with your health care provider about whether you are at high risk of being infected with HIV. If you choose to begin PrEP, you should first be tested for HIV. You should then be tested every 3 months for as long as you are taking PrEP. Pregnancy  If you are premenopausal and you may become pregnant, ask your health care provider about preconception counseling.  If you may become pregnant, take 400 to 800 micrograms (mcg) of folic acid every day.  If you want to prevent pregnancy, talk to your health care provider about birth control (contraception). Osteoporosis and menopause  Osteoporosis is a disease in which the bones lose minerals and strength with aging. This can result in serious bone fractures. Your risk for osteoporosis can be identified using a bone density scan.  If you are 65 years of age or older, or if you are at risk for osteoporosis and fractures, ask your health care provider if you should be screened.  Ask your health care provider whether you should take a calcium or vitamin D supplement to lower your risk for osteoporosis.  Menopause may have certain physical  symptoms and risks.  Hormone replacement therapy may reduce some of these symptoms and risks. Talk to your health care provider about whether hormone replacement therapy is right for you. Follow these instructions at home:  Schedule regular health, dental, and eye exams.  Stay current with your immunizations.  Do not use any tobacco products including cigarettes, chewing tobacco, or electronic cigarettes.  If you are pregnant, do not drink alcohol.  If you are breastfeeding, limit how much and how often you drink alcohol.  Limit alcohol intake to no more than 1 drink per day for nonpregnant women. One drink equals 12 ounces of beer, 5 ounces of wine, or 1 ounces of hard liquor.  Do not use street drugs.  Do not share needles.  Ask your health care provider for help if you need support or information about quitting drugs.  Tell your health care provider if you often feel depressed.  Tell your health care provider if you have ever been abused or do not feel safe at home. This information is not intended to replace advice given to you by your health care provider. Make sure you discuss any questions you have with your health care provider. Document Released: 03/26/2011 Document Revised: 02/16/2016 Document Reviewed: 06/14/2015 [employer] Interactive Patient Education  2018 [employer]  

## 2018-01-06 NOTE — Progress Notes (Signed)
Chief Complaint  Patient presents with  . Annual Exam    wants to discuss the use of Celexa    Subjective:  Cheryl Sanders is a 42 y.o. female here for a health maintenance visit.  Patient is established pt  Patient Active Problem List   Diagnosis Date Noted  . Prehypertension 09/26/2017  . H/O: C-section 01/03/2012    Past Medical History:  Diagnosis Date  . Advanced maternal age in pregnancy    6  . AMA (advanced maternal age) multigravida 35+ 01/03/2012   Declined testing   . Anxiety   . GBS carrier   . H/O varicella   . H/O: C-section    2010  . Infant large for gestational age 78/19/2013   88%ile at 28 weeks. >95% at 38wks   . Mental disorder   . Normal pregnancy in multigravida 01/30/2012  . Pregnant state, incidental 05/20/2012  . Smoking history   . Urinary tract infection    1st tri    Past Surgical History:  Procedure Laterality Date  . CESAREAN SECTION    . CESAREAN SECTION  05/31/2012   Procedure: CESAREAN SECTION;  Surgeon: Delice Lesch, MD;  Location: [facility];  Service: Obstetrics;  Laterality: N/A;  with Bilateral tubal ligation  . DILATION AND CURETTAGE OF UTERUS    . MANDIBLE SURGERY     age 34     Outpatient Medications Prior to Visit  Medication Sig Dispense Refill  . cetirizine (ZYRTEC) 10 MG tablet Take 10 mg by mouth daily.    . fluticasone (FLONASE) 50 MCG/ACT nasal spray Place into both nostrils daily.    . hydrOXYzine (ATARAX/VISTARIL) 10 MG tablet Take 1 tablet (10 mg total) by mouth 3 (three) times daily as needed for anxiety. 30 tablet 0  . ranitidine (ZANTAC) 150 MG tablet Take 150 mg by mouth once.    . citalopram (CELEXA) 10 MG tablet Take 1 tablet (10 mg total) by mouth daily. 90 tablet 1   No facility-administered medications prior to visit.     No Known Allergies   Family History  Problem Relation Age of Onset  . Graves' disease Mother   . Hypertension Father   . Mental illness Father        panic attacks  .  Hypertension Sister   . Kidney disease Sister   . Hypothyroidism Maternal Grandmother   . Stroke Maternal Grandmother   . Arthritis Maternal Grandmother        RA  . COPD Maternal Grandfather        emphysema  . Parkinsonism Paternal Grandmother   . Alzheimer's disease Paternal Grandfather   . Other Neg Hx      Health Habits: Dental Exam: up to date Eye Exam: up to date Exercise: 2 times/week on average Current exercise activities: yard work Diet: balanced   Social History   Socioeconomic History  . Marital status: Married    Spouse name: Not on file  . Number of children: Not on file  . Years of education: Not on file  . Highest education level: Not on file  Occupational History  . Not on file  Social Needs  . Financial resource strain: Not on file  . Food insecurity:    Worry: Not on file    Inability: Not on file  . Transportation needs:    Medical: Not on file    Non-medical: Not on file  Tobacco Use  . Smoking status: Current Every Day Smoker  Packs/day: 0.50    Years: 18.00    Pack years: 9.00    Types: Cigarettes  . Smokeless tobacco: Current User  Substance and Sexual Activity  . Alcohol use: No  . Drug use: No  . Sexual activity: Not Currently    Birth control/protection: None  Lifestyle  . Physical activity:    Days per week: Not on file    Minutes per session: Not on file  . Stress: Not on file  Relationships  . Social connections:    Talks on phone: Not on file    Gets together: Not on file    Attends religious service: Not on file    Active member of club or organization: Not on file    Attends meetings of clubs or organizations: Not on file    Relationship status: Not on file  . Intimate partner violence:    Fear of current or ex partner: Not on file    Emotionally abused: Not on file    Physically abused: Not on file    Forced sexual activity: Not on file  Other Topics Concern  . Not on file  Social History Narrative  . Not on  file   Social History   Substance and Sexual Activity  Alcohol Use No   Social History   Tobacco Use  Smoking Status Current Every Day Smoker  . Packs/day: 0.50  . Years: 18.00  . Pack years: 9.00  . Types: Cigarettes  Smokeless Tobacco Current User   Social History   Substance and Sexual Activity  Drug Use No    GYN: Sexual Health Menstrual status: regular menses LMP: No LMP recorded. (Menstrual status: IUD). Last pap smear: see HM section History of abnormal pap smears:  Sexually active: with female partner Current contraception: IUD  Health Maintenance: See under health Maintenance activity for review of completion dates as well. Immunization History  Administered Date(s) Administered  . Influenza Split 11/15/2011  . Influenza,inj,Quad PF,6+ Mos 11/19/2016      Depression Screen-PHQ2/9 Depression screen [facility] 2/9 01/06/2018 09/26/2017 03/20/2017 12/19/2016 11/19/2016  Decreased Interest 0 0 0 0 1  Down, Depressed, Hopeless 0 0 0 0 1  PHQ - 2 Score 0 0 0 0 2  Altered sleeping - - - - 2  Tired, decreased energy - - - - 2  Change in appetite - - - - 2  Feeling bad or failure about yourself  - - - - 1  Trouble concentrating - - - - 2  Moving slowly or fidgety/restless - - - - 2  Suicidal thoughts - - - - 0  PHQ-9 Score - - - - 13  Difficult doing work/chores - - - - Somewhat difficult       Depression Severity and Treatment Recommendations:  0-4= None  5-9= Mild / Treatment: Support, educate to call if worse; return in one month  10-14= Moderate / Treatment: Support, watchful waiting; Antidepressant or Psycotherapy  15-19= Moderately severe / Treatment: Antidepressant OR Psychotherapy  >= 20 = Major depression, severe / Antidepressant AND Psychotherapy    Review of Systems   Review of Systems  Constitutional: Negative for chills, fever and weight loss.  HENT: Negative for ear pain, hearing loss and tinnitus.   Respiratory: Negative for cough, shortness  of breath and wheezing.   Cardiovascular: Negative for chest pain, palpitations, orthopnea and leg swelling.  Gastrointestinal: Negative for abdominal pain, nausea and vomiting.  Genitourinary: Negative for dysuria, frequency and urgency.  Musculoskeletal: Negative  for myalgias and neck pain.  Skin: Negative for itching and rash.  Neurological: Negative for dizziness, tingling, tremors and headaches.  Psychiatric/Behavioral: Negative for depression. The patient is not nervous/anxious and does not have insomnia.     See HPI for ROS as well.    Objective:   Vitals:   01/06/18 1109  BP: 120/60  Pulse: 86  Temp: 97.7 F (36.5 C)  TempSrc: Oral  SpO2: 99%  Weight: 155 lb (70.3 kg)  Height: '5\' 1"'  (1.549 m)    Body mass index is 29.29 kg/m.  Physical Exam  Constitutional: She is oriented to person, place, and time. She appears well-developed and well-nourished.  HENT:  Head: Normocephalic and atraumatic.  Right Ear: External ear normal.  Left Ear: External ear normal.  Mouth/Throat: Oropharynx is clear and moist. No oropharyngeal exudate.  Eyes: Conjunctivae and EOM are normal.  Neck: Normal range of motion. No thyromegaly present.  Cardiovascular: Normal rate, regular rhythm and normal heart sounds.  No murmur heard. Pulmonary/Chest: Effort normal and breath sounds normal. No stridor. No respiratory distress.  Abdominal: Soft. Bowel sounds are normal. She exhibits no distension and no mass. There is no tenderness. There is no guarding.  Musculoskeletal: Normal range of motion. She exhibits no edema.  Neurological: She is alert and oriented to person, place, and time.  Skin: Skin is warm. Capillary refill takes less than 2 seconds.  Psychiatric: She has a normal mood and affect. Her behavior is normal. Judgment and thought content normal.       Assessment/Plan:   Patient was seen for a health maintenance exam.  Counseled the patient on health maintenance issues. Reviewed  her health mainteance schedule and ordered appropriate tests (see orders.) Counseled on regular exercise and weight management. Recommend regular eye exams and dental cleaning.   The following issues were addressed today for health maintenance:   Anaaya was seen today for annual exam.  Diagnoses and all orders for this visit:  Encounter for health maintenance examination in adult - age appropriate screenings  Adjustment disorder with anxious mood- stable on  Current dose of celexa  Refilled for a year -     CBC -     TSH  Screening, lipid -     Lipid panel -     Comprehensive metabolic panel  Screening for diabetes mellitus -     Hemoglobin A1c  Other orders -     citalopram (CELEXA) 10 MG tablet; Take 1 tablet (10 mg total) by mouth daily.   IUD check, pap smear and mammogram will be done with Gynecology tomorrow 01/07/18   Return in about 1 year (around 01/07/2019).    Body mass index is 29.29 kg/m.:  Discussed the patient's BMI with patient. The BMI body mass index is 29.29 kg/m.     No future appointments.  Patient Instructions       IF you received an x-ray today, you will receive an invoice from [facility]. Please contact [facility] at [phone] with questions or concerns regarding your invoice.   IF you received labwork today, you will receive an invoice from [facility]. Please contact [facility] at [phone] with questions or concerns regarding your invoice.   Our billing staff will not be able to assist you with questions regarding bills from these companies.  You will be contacted with the lab results as soon as they are available. The fastest way to get your results is to activate your My Chart account. Instructions are located  on the last page of this paperwork. If you have not heard from [place] regarding the results in 2 weeks, please contact this office.    Health Maintenance, Female Adopting a healthy lifestyle and getting  preventive care can go a long way to promote health and wellness. Talk with your health care provider about what schedule of regular examinations is right for you. This is a good chance for you to check in with your provider about disease prevention and staying healthy. In between checkups, there are plenty of things you can do on your own. Experts have done a lot of research about which lifestyle changes and preventive measures are most likely to keep you healthy. Ask your health care provider for more information. Weight and diet Eat a healthy diet  Be sure to include plenty of vegetables, fruits, low-fat dairy products, and lean protein.  Do not eat a lot of foods high in solid fats, added sugars, or salt.  Get regular exercise. This is one of the most important things you can do for your health. ? Most adults should exercise for at least 150 minutes each week. The exercise should increase your heart rate and make you sweat (moderate-intensity exercise). ? Most adults should also do strengthening exercises at least twice a week. This is in addition to the moderate-intensity exercise.  Maintain a healthy weight  Body mass index (BMI) is a measurement that can be used to identify possible weight problems. It estimates body fat based on height and weight. Your health care provider can help determine your BMI and help you achieve or maintain a healthy weight.  For females 79 years of age and older: ? A BMI below 18.5 is considered underweight. ? A BMI of 18.5 to 24.9 is normal. ? A BMI of 25 to 29.9 is considered overweight. ? A BMI of 30 and above is considered obese.  Watch levels of cholesterol and blood lipids  You should start having your blood tested for lipids and cholesterol at 42 years of age, then have this test every 5 years.  You may need to have your cholesterol levels checked more often if: ? Your lipid or cholesterol levels are high. ? You are older than 42 years of  age. ? You are at high risk for heart disease.  Cancer screening Lung Cancer  Lung cancer screening is recommended for adults 23-64 years old who are at high risk for lung cancer because of a history of smoking.  A yearly low-dose CT scan of the lungs is recommended for people who: ? Currently smoke. ? Have quit within the past 15 years. ? Have at least a 30-pack-year history of smoking. A pack year is smoking an average of one pack of cigarettes a day for 1 year.  Yearly screening should continue until it has been 15 years since you quit.  Yearly screening should stop if you develop a health problem that would prevent you from having lung cancer treatment.  Breast Cancer  Practice breast self-awareness. This means understanding how your breasts normally appear and feel.  It also means doing regular breast self-exams. Let your health care provider know about any changes, no matter how small.  If you are in your 20s or 30s, you should have a clinical breast exam (CBE) by a health care provider every 1-3 years as part of a regular health exam.  If you are 36 or older, have a CBE every year. Also consider having a breast X-ray (mammogram)  every year.  If you have a family history of breast cancer, talk to your health care provider about genetic screening.  If you are at high risk for breast cancer, talk to your health care provider about having an MRI and a mammogram every year.  Breast cancer gene (BRCA) assessment is recommended for women who have family members with BRCA-related cancers. BRCA-related cancers include: ? Breast. ? Ovarian. ? Tubal. ? Peritoneal cancers.  Results of the assessment will determine the need for genetic counseling and BRCA1 and BRCA2 testing.  Cervical Cancer Your health care provider may recommend that you be screened regularly for cancer of the pelvic organs (ovaries, uterus, and vagina). This screening involves a pelvic examination, including  checking for microscopic changes to the surface of your cervix (Pap test). You may be encouraged to have this screening done every 3 years, beginning at age 25.  For women ages 49-65, health care providers may recommend pelvic exams and Pap testing every 3 years, or they may recommend the Pap and pelvic exam, combined with testing for human papilloma virus (HPV), every 5 years. Some types of HPV increase your risk of cervical cancer. Testing for HPV may also be done on women of any age with unclear Pap test results.  Other health care providers may not recommend any screening for nonpregnant women who are considered low risk for pelvic cancer and who do not have symptoms. Ask your health care provider if a screening pelvic exam is right for you.  If you have had past treatment for cervical cancer or a condition that could lead to cancer, you need Pap tests and screening for cancer for at least 20 years after your treatment. If Pap tests have been discontinued, your risk factors (such as having a new sexual partner) need to be reassessed to determine if screening should resume. Some women have medical problems that increase the chance of getting cervical cancer. In these cases, your health care provider may recommend more frequent screening and Pap tests.  Colorectal Cancer  This type of cancer can be detected and often prevented.  Routine colorectal cancer screening usually begins at 42 years of age and continues through 42 years of age.  Your health care provider may recommend screening at an earlier age if you have risk factors for colon cancer.  Your health care provider may also recommend using home test kits to check for hidden blood in the stool.  A small camera at the end of a tube can be used to examine your colon directly (sigmoidoscopy or colonoscopy). This is done to check for the earliest forms of colorectal cancer.  Routine screening usually begins at age 60.  Direct examination of  the colon should be repeated every 5-10 years through 42 years of age. However, you may need to be screened more often if early forms of precancerous polyps or small growths are found.  Skin Cancer  Check your skin from head to toe regularly.  Tell your health care provider about any new moles or changes in moles, especially if there is a change in a mole's shape or color.  Also tell your health care provider if you have a mole that is larger than the size of a pencil eraser.  Always use sunscreen. Apply sunscreen liberally and repeatedly throughout the day.  Protect yourself by wearing long sleeves, pants, a wide-brimmed hat, and sunglasses whenever you are outside.  Heart disease, diabetes, and high blood pressure  High blood pressure causes  heart disease and increases the risk of stroke. High blood pressure is more likely to develop in: ? People who have blood pressure in the high end of the normal range (130-139/85-89 mm Hg). ? People who are overweight or obese. ? People who are African American.  If you are 40-52 years of age, have your blood pressure checked every 3-5 years. If you are 39 years of age or older, have your blood pressure checked every year. You should have your blood pressure measured twice-once when you are at a hospital or clinic, and once when you are not at a hospital or clinic. Record the average of the two measurements. To check your blood pressure when you are not at a hospital or clinic, you can use: ? An automated blood pressure machine at a pharmacy. ? A home blood pressure monitor.  If you are between 39 years and 79 years old, ask your health care provider if you should take aspirin to prevent strokes.  Have regular diabetes screenings. This involves taking a blood sample to check your fasting blood sugar level. ? If you are at a normal weight and have a low risk for diabetes, have this test once every three years after 42 years of age. ? If you are  overweight and have a high risk for diabetes, consider being tested at a younger age or more often. Preventing infection Hepatitis B  If you have a higher risk for hepatitis B, you should be screened for this virus. You are considered at high risk for hepatitis B if: ? You were born in a country where hepatitis B is common. Ask your health care provider which countries are considered high risk. ? Your parents were born in a high-risk country, and you have not been immunized against hepatitis B (hepatitis B vaccine). ? You have HIV or AIDS. ? You use needles to inject street drugs. ? You live with someone who has hepatitis B. ? You have had sex with someone who has hepatitis B. ? You get hemodialysis treatment. ? You take certain medicines for conditions, including cancer, organ transplantation, and autoimmune conditions.  Hepatitis C  Blood testing is recommended for: ? Everyone born from [birth year] through 1965. ? Anyone with known risk factors for hepatitis C.  Sexually transmitted infections (STIs)  You should be screened for sexually transmitted infections (STIs) including gonorrhea and chlamydia if: ? You are sexually active and are younger than 42 years of age. ? You are older than 41 years of age and your health care provider tells you that you are at risk for this type of infection. ? Your sexual activity has changed since you were last screened and you are at an increased risk for chlamydia or gonorrhea. Ask your health care provider if you are at risk.  If you do not have HIV, but are at risk, it may be recommended that you take a prescription medicine daily to prevent HIV infection. This is called pre-exposure prophylaxis (PrEP). You are considered at risk if: ? You are sexually active and do not regularly use condoms or know the HIV status of your partner(s). ? You take drugs by injection. ? You are sexually active with a partner who has HIV.  Talk with your health care provider  about whether you are at high risk of being infected with HIV. If you choose to begin PrEP, you should first be tested for HIV. You should then be tested every 3 months for as long as  you are taking PrEP. Pregnancy  If you are premenopausal and you may become pregnant, ask your health care provider about preconception counseling.  If you may become pregnant, take 400 to 800 micrograms (mcg) of folic acid every day.  If you want to prevent pregnancy, talk to your health care provider about birth control (contraception). Osteoporosis and menopause  Osteoporosis is a disease in which the bones lose minerals and strength with aging. This can result in serious bone fractures. Your risk for osteoporosis can be identified using a bone density scan.  If you are 21 years of age or older, or if you are at risk for osteoporosis and fractures, ask your health care provider if you should be screened.  Ask your health care provider whether you should take a calcium or vitamin D supplement to lower your risk for osteoporosis.  Menopause may have certain physical symptoms and risks.  Hormone replacement therapy may reduce some of these symptoms and risks. Talk to your health care provider about whether hormone replacement therapy is right for you. Follow these instructions at home:  Schedule regular health, dental, and eye exams.  Stay current with your immunizations.  Do not use any tobacco products including cigarettes, chewing tobacco, or electronic cigarettes.  If you are pregnant, do not drink alcohol.  If you are breastfeeding, limit how much and how often you drink alcohol.  Limit alcohol intake to no more than 1 drink per day for nonpregnant women. One drink equals 12 ounces of beer, 5 ounces of wine, or 1 ounces of hard liquor.  Do not use street drugs.  Do not share needles.  Ask your health care provider for help if you need support or information about quitting drugs.  Tell your  health care provider if you often feel depressed.  Tell your health care provider if you have ever been abused or do not feel safe at home. This information is not intended to replace advice given to you by your health care provider. Make sure you discuss any questions you have with your health care provider. Document Released: 03/26/2011 Document Revised: 02/16/2016 Document Reviewed: 06/14/2015 Elsevier Interactive Patient Education  [employer].

## 2018-01-07 LAB — COMPREHENSIVE METABOLIC PANEL
A/G RATIO: 1.6 (ref 1.2–2.2)
ALT: 15 IU/L (ref 0–32)
AST: 17 IU/L (ref 0–40)
Albumin: 4.5 g/dL (ref 3.5–5.5)
Alkaline Phosphatase: 56 IU/L (ref 39–117)
BILIRUBIN TOTAL: 0.3 mg/dL (ref 0.0–1.2)
BUN/Creatinine Ratio: 11 (ref 9–23)
BUN: 9 mg/dL (ref 6–24)
CHLORIDE: 100 mmol/L (ref 96–106)
CO2: 25 mmol/L (ref 20–29)
Calcium: 9.6 mg/dL (ref 8.7–10.2)
Creatinine, Ser: 0.8 mg/dL (ref 0.57–1.00)
GFR calc Af Amer: 106 mL/min/{1.73_m2} (ref >59)
GFR calc non Af Amer: 92 mL/min/{1.73_m2} (ref >59)
GLUCOSE: 82 mg/dL (ref 65–99)
Globulin, Total: 2.8 g/dL (ref 1.5–4.5)
POTASSIUM: 4.9 mmol/L (ref 3.5–5.2)
Sodium: 140 mmol/L (ref 134–144)
Total Protein: 7.3 g/dL (ref 6.0–8.5)

## 2018-01-07 LAB — HEMOGLOBIN A1C
Est. average glucose Bld gHb Est-mCnc: 100 mg/dL
Hgb A1c MFr Bld: 5.1 % (ref 4.8–5.6)

## 2018-01-07 LAB — CBC
Hematocrit: 46.6 % (ref 34.0–46.6)
Hemoglobin: 15.9 g/dL (ref 11.1–15.9)
MCH: 29.6 pg (ref 26.6–33.0)
MCHC: 34.1 g/dL (ref 31.5–35.7)
MCV: 87 fL (ref 79–97)
Platelets: 284 10*3/uL (ref 150–379)
RBC: 5.38 x10E6/uL — ABNORMAL HIGH (ref 3.77–5.28)
RDW: 13 % (ref 12.3–15.4)
WBC: 6.8 10*3/uL (ref 3.4–10.8)

## 2018-01-07 LAB — LIPID PANEL
CHOL/HDL RATIO: 3.9 ratio (ref 0.0–4.4)
CHOLESTEROL TOTAL: 173 mg/dL (ref 100–199)
HDL: 44 mg/dL (ref >39)
LDL Calculated: 105 mg/dL — ABNORMAL HIGH (ref 0–99)
Triglycerides: 121 mg/dL (ref 0–149)
VLDL Cholesterol Cal: 24 mg/dL (ref 5–40)

## 2018-01-07 LAB — TSH: TSH: 2.77 u[IU]/mL (ref 0.450–4.500)

## 2019-03-12 ENCOUNTER — Ambulatory Visit: Payer: 59 | Admitting: Registered Nurse

## 2019-03-12 ENCOUNTER — Encounter: Payer: Self-pay | Admitting: Registered Nurse

## 2019-03-12 ENCOUNTER — Other Ambulatory Visit: Payer: Self-pay

## 2019-03-12 VITALS — BP 133/80 | HR 72 | Temp 98.2°F | Resp 16 | Wt 153.0 lb

## 2019-03-12 DIAGNOSIS — R350 Frequency of micturition: Secondary | ICD-10-CM | POA: Diagnosis not present

## 2019-03-12 LAB — POCT URINALYSIS DIP (MANUAL ENTRY)
Bilirubin, UA: NEGATIVE
Blood, UA: NEGATIVE
Glucose, UA: NEGATIVE mg/dL
Ketones, POC UA: NEGATIVE mg/dL
Nitrite, UA: NEGATIVE
Protein Ur, POC: NEGATIVE mg/dL
Spec Grav, UA: 1.015 (ref 1.010–1.025)
Urobilinogen, UA: 0.2 E.U./dL
pH, UA: 6 (ref 5.0–8.0)

## 2019-03-12 MED ORDER — NITROFURANTOIN MONOHYD MACRO 100 MG PO CAPS: 100.0000 mg | ORAL_CAPSULE | Freq: Two times a day (BID) | ORAL | 0 refills | Status: AC

## 2019-03-12 NOTE — Progress Notes (Signed)
Established Patient Office Visit  Subjective:  Patient ID: Cheryl Sanders, female    DOB: 09-17-1976  Age: 43 y.o. MRN: [id number]  CC:  Chief Complaint  Patient presents with  . Urinary Frequency    with pressure x 3 days    HPI Cheryl Sanders presents for Urinary frequency.   Pt reports she has had a history of UTIs and this one has similar symptoms. She states her most recent UTI was around 1 month prior to this visit. They previously have not been this close together.   She reports that at that time she was seen via telehealth with an app provided by her insurance company. Unfortunately, she does not remember what antibiotic she was given at that time. She did not have lab work done.  She reports 3 days of urgency, frequency, and some suprapubic discomfort. She denies flank pain and CVA tenderness. Denies hematuria. Denies vaginal discharge, odor, itching.  Past Medical History:  Diagnosis Date  . Advanced maternal age in pregnancy    [age]  . AMA (advanced maternal age) multigravida 35+ 01/03/2012   Declined testing   . Anxiety   . GBS carrier   . H/O varicella   . H/O: C-section    2010  . Infant large for gestational age 61/19/2013   88%ile at 28 weeks. >95% at 38wks   . Mental disorder   . Normal pregnancy in multigravida 01/30/2012  . Pregnant state, incidental 05/20/2012  . Smoking history   . Urinary tract infection    1st tri    Past Surgical History:  Procedure Laterality Date  . CESAREAN SECTION    . CESAREAN SECTION  05/31/2012   Procedure: CESAREAN SECTION;  Surgeon: Purcell NailsAngela Y Roberts, MD;  Location: [facility];  Service: Obstetrics;  Laterality: N/A;  with Bilateral tubal ligation  . DILATION AND CURETTAGE OF UTERUS    . MANDIBLE SURGERY     age 43    Family History  Problem Relation Age of Onset  . Graves' disease Mother   . Hypertension Father   . Mental illness Father        panic attacks  . Hypertension Sister   . Kidney disease  Sister   . Hypothyroidism Maternal Grandmother   . Stroke Maternal Grandmother   . Arthritis Maternal Grandmother        RA  . COPD Maternal Grandfather        emphysema  . Parkinsonism Paternal Grandmother   . Alzheimer's disease Paternal Grandfather   . Other Neg Hx     Social History   Socioeconomic History  . Marital status: Married    Spouse name: Not on file  . Number of children: Not on file  . Years of education: Not on file  . Highest education level: Not on file  Occupational History  . Not on file  Social Needs  . Financial resource strain: Not on file  . Food insecurity    Worry: Not on file    Inability: Not on file  . Transportation needs    Medical: Not on file    Non-medical: Not on file  Tobacco Use  . Smoking status: Current Every Day Smoker    Packs/day: 0.50    Years: 18.00    Pack years: 9.00    Types: Cigarettes  . Smokeless tobacco: Current User  Substance and Sexual Activity  . Alcohol use: No  . Drug use: No  . Sexual activity: Not Currently  Birth control/protection: None  Lifestyle  . Physical activity    Days per week: Not on file    Minutes per session: Not on file  . Stress: Not on file  Relationships  . Social Musicianconnections    Talks on phone: Not on file    Gets together: Not on file    Attends religious service: Not on file    Active member of club or organization: Not on file    Attends meetings of clubs or organizations: Not on file    Relationship status: Not on file  . Intimate partner violence    Fear of current or ex partner: Not on file    Emotionally abused: Not on file    Physically abused: Not on file    Forced sexual activity: Not on file  Other Topics Concern  . Not on file  Social History Narrative  . Not on file    Outpatient Medications Prior to Visit  Medication Sig Dispense Refill  . cetirizine (ZYRTEC) 10 MG tablet Take 10 mg by mouth daily.    . citalopram (CELEXA) 10 MG tablet Take 1 tablet (10 mg  total) by mouth daily. 90 tablet 3  . fluticasone (FLONASE) 50 MCG/ACT nasal spray Place into both nostrils daily.    . hydrOXYzine (ATARAX/VISTARIL) 10 MG tablet Take 1 tablet (10 mg total) by mouth 3 (three) times daily as needed for anxiety. 30 tablet 0  . ranitidine (ZANTAC) 150 MG tablet Take 150 mg by mouth once.     No facility-administered medications prior to visit.     No Known Allergies  ROS Review of Systems  Constitutional: Negative.   HENT: Negative.   Eyes: Negative.   Respiratory: Negative.   Cardiovascular: Negative.   Gastrointestinal: Negative.   Endocrine: Negative.   Genitourinary: Positive for frequency, pelvic pain and urgency. Negative for vaginal discharge and vaginal pain.       Per hpi  Musculoskeletal: Negative.   Skin: Negative.   Allergic/Immunologic: Negative.   Neurological: Negative.   Hematological: Negative.   Psychiatric/Behavioral: Negative.   All other systems reviewed and are negative.     Objective:    Physical Exam  Constitutional: She is oriented to person, place, and time. She appears well-developed and well-nourished.  Cardiovascular: Normal rate and regular rhythm.  Pulmonary/Chest: Effort normal. No respiratory distress.  Neurological: She is alert and oriented to person, place, and time.  Skin: Skin is warm and dry.  Psychiatric: She has a normal mood and affect. Her behavior is normal. Judgment and thought content normal.  Nursing note and vitals reviewed. No CVA Tenderness  BP 133/80   Pulse 72   Temp 98.2 F (36.8 C) (Oral)   Resp 16   Wt 153 lb (69.4 kg)   SpO2 98%   BMI 28.91 kg/m  Wt Readings from Last 3 Encounters:  03/12/19 153 lb (69.4 kg)  01/06/18 155 lb (70.3 kg)  09/26/17 152 lb 12.8 oz (69.3 kg)     Health Maintenance Due  Topic Date Due  . TETANUS/TDAP  04/18/1995  . PAP SMEAR-Modifier  04/17/1997    There are no preventive care reminders to display for this patient.  Lab Results   Component Value Date   TSH 2.770 01/06/2018   Lab Results  Component Value Date   WBC 6.8 01/06/2018   HGB 15.9 01/06/2018   HCT 46.6 01/06/2018   MCV 87 01/06/2018   PLT 284 01/06/2018   Lab Results  Component  Value Date   NA 140 01/06/2018   K 4.9 01/06/2018   CO2 25 01/06/2018   GLUCOSE 82 01/06/2018   BUN 9 01/06/2018   CREATININE 0.80 01/06/2018   BILITOT 0.3 01/06/2018   ALKPHOS 56 01/06/2018   AST 17 01/06/2018   ALT 15 01/06/2018   PROT 7.3 01/06/2018   ALBUMIN 4.5 01/06/2018   CALCIUM 9.6 01/06/2018   Lab Results  Component Value Date   CHOL 173 01/06/2018   Lab Results  Component Value Date   HDL 44 01/06/2018   Lab Results  Component Value Date   LDLCALC 105 (H) 01/06/2018   Lab Results  Component Value Date   TRIG 121 01/06/2018   Lab Results  Component Value Date   CHOLHDL 3.9 01/06/2018   Lab Results  Component Value Date   HGBA1C 5.1 01/06/2018      Assessment & Plan:   Problem List Items Addressed This Visit    None    Visit Diagnoses    Urinary frequency    -  Primary   Relevant Medications   nitrofurantoin, macrocrystal-monohydrate, (MACROBID) 100 MG capsule   Other Relevant Orders   POCT urinalysis dipstick (Completed)   Urine Culture      Meds ordered this encounter  Medications  . nitrofurantoin, macrocrystal-monohydrate, (MACROBID) 100 MG capsule    Sig: Take 1 capsule (100 mg total) by mouth 2 (two) times daily for 7 days.    Dispense:  14 capsule    Refill:  0    Order Specific Question:   Supervising Provider    Answer:   Forrest Moron [id number]    Follow-up: No follow-ups on file.   PLAN:  Macrobid 100mg  PO bid for 7 days for recurrent UTI.  Culture sent - if this shows resistance to Macrobid, we will change the agent at that time and pursue one that will be more effective.  Pt should follow up with symptoms that worsen or fail to improve  Reviewed nonpharm means of infection prevention: void after  intercourse, wipe front to back, wear loose clothing and natural materials when sleeping, wash with unscented / low perfume soaps/washes.   Patient encouraged to call clinic with any questions, comments, or concerns.   Maximiano Coss, NP

## 2019-03-12 NOTE — Patient Instructions (Signed)
° ° ° °  If you have lab work done today you will be contacted with your lab results within the next 2 weeks.  If you have not heard from us then please contact us. The fastest way to get your results is to register for My Chart. ° ° °IF you received an x-ray today, you will receive an invoice from [facility]. Please contact [facility] at [phone] with questions or concerns regarding your invoice.  ° °IF you received labwork today, you will receive an invoice from [facility]. Please contact [facility] at [phone] with questions or concerns regarding your invoice.  ° °Our billing staff will not be able to assist you with questions regarding bills from these companies. ° °You will be contacted with the lab results as soon as they are available. The fastest way to get your results is to activate your My Chart account. Instructions are located on the last page of this paperwork. If you have not heard from us regarding the results in 2 weeks, please contact this office. °  ° ° ° °

## 2019-03-14 LAB — URINE CULTURE

## 2022-02-08 ENCOUNTER — Ambulatory Visit (INDEPENDENT_AMBULATORY_CARE_PROVIDER_SITE_OTHER): Payer: 59 | Admitting: Psychology

## 2022-02-08 DIAGNOSIS — F89 Unspecified disorder of psychological development: Secondary | ICD-10-CM

## 2022-02-08 NOTE — Progress Notes (Addendum)
Date: 02/08/2022 Appointment Start Time: 5pm Duration: 100 minutes Provider: Helmut Muster, PsyD Type of Session: Initial Appointment for Evaluation  Location of Patient: Home Location of Provider: Provider's Home (private office) Type of Contact: WebEx video visit with audio  Session Content:  Prior to proceeding with today's appointment, two pieces of identifying information were obtained from [facility] to verify identity. In addition, Cheryl Sanders's physical location at the time of this appointment was obtained. In the event of technical difficulties, Cheryl Sanders shared a phone number she could be reached at. Cheryl Sanders and this provider participated in today's telepsychological service. Cheryl Sanders denied anyone else being present in the room or on the virtual appointment.  The provider's role was explained to [facility]. The provider reviewed and discussed issues of confidentiality, privacy, and limits therein (e.g., reporting obligations). In addition to verbal informed consent, written informed consent for psychological services was obtained from [facility] prior to the initial appointment. Written consent included information concerning the practice, financial arrangements, and confidentiality and patients' rights. Since the clinic is not a 24/7 crisis center, mental health emergency resources were shared, and the provider explained e-mail, voicemail, and/or other messaging systems should be utilized only for non-emergency reasons. This provider also explained that information obtained during appointments will be placed in their electronic medical record in a confidential manner. Cheryl Sanders verbally acknowledged understanding of the aforementioned and agreed to use mental health emergency resources discussed if needed. Moreover, Cheryl Sanders agreed information may be shared with other [facility] or their referring provider(s) as needed for coordination of care. By signing the new patient documents, Cheryl Sanders provided written  consent for coordination of care. Cheryl Sanders verbally acknowledged understanding she is ultimately responsible for understanding her insurance benefits as it relates to reimbursement of telepsychological and in-person services. This provider also reviewed confidentiality, as it relates to telepsychological services, as well as the rationale for telepsychological services. This provider further explained that video should not be captured, photos should not be taken, nor should testing stimuli be copied or recorded as it would be a copyright violation. Cheryl Sanders expressed understanding of the aforementioned, and verbally consented to proceed.  Sheyann completed the psychiatric diagnostic evaluation (history, including past, family, social, and  psychiatric history; behavioral observations; and establishment of a provisional diagnosis). The evaluation was completed in 100 minutes. Code 30092 was billed.   Mental Status Examination:  Appearance:  neat Behavior: appropriate to circumstances Mood: anxious Affect: mood congruent Speech: pressured Eye Contact: appropriate Psychomotor Activity: restless Thought Process: linear, logical, and goal directed and denies suicidal, homicidal, and self-harm ideation, plan and intent Content/Perceptual Disturbances: none Orientation: AAOx4 Cognition/Sensorium: intact Insight: good Judgment: good  Provisional DSM-5 diagnosis(es):  F89 Unspecified Disorder of Psychological Development   Plan: Cheryl Sanders is currently scheduled for an appointment on 02/16/2022 at 2pm via WebEx video visit with audio.        CONFIDENTIAL PSYCHOLOGICAL EVALUATION ______________________________________________________________________________  Name: Cheryl Sanders   Date of Birth: [birth year]   Age: 46  SOURCE AND REASON FOR REFERRAL: Cheryl Sanders was self-referred for an evaluation to ascertain if she meets criteria for Attention Deficit/Hyperactivity Disorder  (ADHD).    BACKGROUND INFORMATION AND PRESENTING PROBLEM: Cheryl Sanders is a 46 year old female who resides in [place], [place]  Ms. Redditt reported she is pursuing an ADHD evaluation secondary to her son having been diagnosed with ADHD five years ago and having experiencing ADHD-related difficulties since childhood that she "did not realize" at that time that "some of what [she] did was abnormal."  She further reported how she is "becoming more aware of [ADHD] and how it affects [her son]" as well as is "having suspicions about having [ADHD]" and ADHD-related difficulties are causing issues as her employment, which she attributed to the tasks becoming more "cognitively demanding" and the "repercussions [for problems] becoming more severe."  She shared she is "seeking a [ADHD] diagnosis " so she can "better manage it." She described her ADHD-related concerns as including indecisiveness and needing to be in the "right head space" to start tasks, tending to underestimate how long a task will take and losing track of time when hyperfixated on a task, which can negatively impact her ability to complete other priorities; being easily distracted by various stimuli (e.g., lights, sounds, and thoughts); efforts to "mask" her ADHD-related symptoms (e.g., trying to "make an appropriate amount of eye contact," "responding appropriately," and/or staying withdrawn in social interactions) that require significant effort and make social interactions fatiguing; frequently interrupting others if she is "excited" by the conversation or does not want to forget what she wants to say; making careless mistakes that others (e.g., supervisors and teachers) have commented on; difficulty waiting; others complain she is not listening; problems maintaining an organization system that most others view as organized; trouble planning which results in her relying on others to do so; regularly being  forgetful (e.g., misplacing items, leaving beverage cups throughout her home, and missing appointments and events); frequently fidgeting and squirming (e.g., "playing with her hands," rotating her ring, tapping her feet, changing seated positions, and "playing with items") as well as feeling restless and occasionally leaving her seat when she has to stay seated; task initiation (e.g., delaying starting tasks that require cognitive effort), task maintenance (e.g., becoming distracted by other tasks and starting them before finishing the initial task), and task disengagement (e.g., hyperfixation on certain tasks to the detriment of others); alternating between impulsive decision making that leads to decisions she later regrets or "overthinking" decisions when experiencing anxiety; "putting off" sleep due to difficulty disengaging from a task she enjoys, her mind being "more active" when trying to fall asleep, talking and moving in her sleep, although she noted she generally feels rested upon waking; driving much faster than others as she is often "not paying attention to [her] speed," which has resulted in nine-to-eleven speeding tickets throughout her life; having a hard time following proper order and needing to frequently re-read directions if there were "more than two steps;" commonly being late to events; and experiencing irritability if she is required to wait or "sit still" for extended periods, repeat herself, or shift her attention. She also described experiencing "sensory issues" (e.g., light sensitivity, feeling overstimulated when there are lots of sounds, and avoidance of wearing layers of clothes or clothes with "itchy" tags) and difficulty in social interactions, although indicated she does not avoid social settings or experience social deficits (i.e., her husband mentions she is "good at talking with others" but it requires a lot of masking behaviors and effort for her to do so). She discussed past  treatment by classmates as a child has led to avoidance or distress in certain settings (e.g., malls and high schools), hypervigilance-related symptoms, flashbacks, and "physical responses" to trauma-related reminders. Additionally, she discussed an extended history of periods of low mood with self-critical thoughts, lowered self-worth, occasional suicidal ideation without plan or intent as well as anxiety secondary to ADHD-related difficulties, other's perceptions of her, and concerns about her children's wellbeing. She noted engaging in cutting and burning  self-harm behaviors during college, her last instance of suicidal ideation was 18 years ago, and confidence in her ability to keep herself safe. She expressed a belief her ADHD-related concerns are independent of mood, but anxiety can exacerbate them. She stated her coping strategies include doing less cognitively demanding tasks when having a hard time starting a cognitively demanding task, having others proofread her work, finding an organization she can maintain, utilizing others for areas of weakness, and putting her eyeglasses in same place.  Ms. Elenes described having gotten in trouble for "talking a lot" during class but became "more withdrawn" due to others "viewing [her] as different" and occasionally teasing her, although noted her relationship with classmates was overall "good" and she had friends she "viewed as safe." She discussed having gotten in trouble for "talking a lot" during class as a young child but became "more withdrawn" due to other's perceptions of her as she became older. 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 She further reported she eventually "dropped out" due to a pregnancy, having a low GPA, and various stressors. She added she eventually returned to college and obtained a master's degree in [employer]. 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 Neave-Johnson reported her medical history is unremarkable. She denied having ever experienced head injury or seizures. She stated social use of alcohol that involves one-to-two standard size drinks a month, daily use of pack of cigarettes, and four-to-six cups of coffee a day that can contribute to sleep onset issues but is also able to "take a nap after drinking [caffeine]." She further stated having a legal history that includes going to court for speeding tickets and forgetting to pay her car registration. She denied ever experiencing obsessions or compulsions or homicidal ideation, plan, or intent. She described her familial mental health history as significant for ADHD (son); depression and "head injury" (mother), anxiety (father), and possible ADHD (father and sister).           Margarite Gouge, PsyD

## 2022-02-16 ENCOUNTER — Ambulatory Visit: Payer: 59 | Admitting: Psychology

## 2022-02-16 DIAGNOSIS — F89 Unspecified disorder of psychological development: Secondary | ICD-10-CM

## 2022-02-16 NOTE — Progress Notes (Signed)
Date: 02/16/2022   Appointment Start Time: 2pm Duration: 110 minutes Provider: Helmut Muster, PsyD Type of Session: Testing Appointment for Evaluation  Location of Patient: Home Location of Provider: Provider's Home (private office) Type of Contact: WebEx video visit with audio  Session Content: Today's appointment was a telepsychological visit due to COVID-19. Cheryl Sanders is aware it is her responsibility to secure confidentiality on her end of the session. Prior to proceeding with today's appointment, Cheryl Sanders's physical location at the time of this appointment was obtained as well a phone number she could be reached at in the event of technical difficulties. Cheryl Sanders denied anyone else being present in the room or on the virtual appointment. This provider reviewed that video should not be captured, photos should not be taken, nor should testing stimuli be copied or recorded as it would be a copyright violation. Cheryl Sanders expressed understanding of the aforementioned, and verbally consented to proceed. The WAIS-IV was administered, scored, and interpreted by this evaluator  Billing codes will be input on the feedback appointment. There are no billing codes for the testing appointment.   Provisional DSM-5 diagnosis(es):  F89 Unspecified Disorder of Psychological Development   Plan: Cheryl Sanders was scheduled for a feedback appointment on 02/21/2022 at 1pm via WebEx video visit with audio.                Margarite Gouge, PsyD

## 2022-02-21 ENCOUNTER — Ambulatory Visit (INDEPENDENT_AMBULATORY_CARE_PROVIDER_SITE_OTHER): Payer: 59 | Admitting: Psychology

## 2022-02-21 DIAGNOSIS — F902 Attention-deficit hyperactivity disorder, combined type: Secondary | ICD-10-CM | POA: Diagnosis not present

## 2022-02-21 NOTE — Progress Notes (Signed)
Testing and Report Writing Information: The following measures  were administered, scored, and interpreted by this provider:  Generalized Anxiety Disorder-7 (GAD-7; 5 minutes), Patient Health Questionnaire-9 (PHQ-9; 5 minutes), Wechsler Adult Intelligence Scale-Fourth Edition (WAIS-IV; 60 minutes), CNS Vital Signs (45 minutes), Adult Attention Deficit/Hyperactivity Disorder Self-Report Scale Checklist (ASRSv1.1; 15 minutes), Behavior Rating Inventory for Executive Function - A - Self Report (BRIEF A; 10 minutes), PTSD Checklist for DSM-5 (PCL-5; 15 minutes), and Personality Assessment Inventory (PAI; 50 minutes). A total of 190 minutes was spent on the administration of the scoring of the aforementioned measures and codes 96136 and [phone] (5 units - this evaluator purposely charged less billing codes than the time requirements allotted for) were billed.  Please see the assessment for additional details. This provider completed the written report which includes integration of patient data, interpretation of standardized test results, interpretation of clinical data, review of information provided by Cheryl Sanders and any collateral information/documentation, and clinical decision making (265 minutes in total).  Feedback Appointment: Date: 02/21/2022 Appointment Start Time: 1:03pm Duration: 50 minutes Provider: Clarice Pole, PsyD Type of Session: Feedback Appointment for Evaluation  Location of Patient: Work Location of Provider: [place] (private office) Type of Contact: Webex video visit with audio  Session Content: Today's appointment was a telepsychological visit due to COVID-19. Cheryl Sanders is aware it is her responsibility to secure confidentiality on her end of the session. She provided verbal consent to proceed with today's appointment. Prior to proceeding with today's appointment, Cheryl Sanders's physical location at the time of this appointment was obtained as well a phone number she could be reached at  in the event of technical difficulties. Cheryl Sanders denied anyone else being present in the room or on the virtual appointment.  This provider and Cheryl Sanders completed the interactive feedback session which includes reviewing the following measures: Generalized Anxiety Disorder-7 (GAD-7), Patient Health Questionnaire-9 (PHQ-9), Wechsler Adult Intelligence Scale-Fourth Edition (WAIS-IV), CNS Vital Signs, Adult Attention Deficit/Hyperactivity Disorder Self-Report Scale Checklist (ASRSv1.1), Behavior Rating Inventory for Executive Function - A - Self Report (Brief- A), Personality Assessment Inventory (PAI). During this interactive feedback session, results of the aforementioned measures, treatment recommendations, and diagnostic conclusions were discussed.   The interactive feedback session was completed today and a total of 50 minutes was spent on feedback. Code 25498 was billed for feedback session.   DSM-5 Diagnosis(es):  F90.2 Attention-Deficit/Hyperactivity Disorder, Combined Presentation, Severe  Time Requirements: Assessment scoring and interpreting: 205 total minutes (billing code [phone] and [phone] [5 units - this evaluator purposely charged less billing codes than the time requirements allowed for]) Feedback: 50 minutes (billing code [phone]) Report writing: 265 total minutes. 02/10/2022: 8:40-9:40. 02/11/2022: 11:45-12pm. 02/11/2022: 10-10:20am and 1:15-2:25pm. 02/16/2022: 4:25-5pm. 02/19/2022: 8-8:40pm. 02/20/2022: 11-11:25am. (billing code [phone] [4 units])  Plan: Cheryl Sanders provided verbal consent for her evaluation to be sent via e-mail. No further follow-up planned by this provider.       CONFIDENTIAL PSYCHOLOGICAL EVALUATION ______________________________________________________________________________  Name: Cheryl Sanders   Date of Birth: 1975-11-24   Age: 46 Dates of Evaluation: 02/08/2022, 02/10/2022, and 02/16/2022  SOURCE AND REASON FOR REFERRAL: Cheryl Sanders was  self-referred for an evaluation to ascertain if she meets criteria for Attention Deficit/Hyperactivity Disorder (ADHD).   EVALUATIVE PROCEDURES: Clinical Interview with Cheryl Sanders (02/08/2022) Wechsler Adult Intelligence Scale-Fourth Edition (WAIS-IV; 02/16/2022) CNS Vital Signs (02/10/2022) Adult Attention Deficit/Hyperactivity Disorder Self-Report Scale Checklist (02/10/2022) Behavior Rating Inventory for Executive Function - A - Self Report (BRIEF-A; 02/10/2022) Personality Assessment Inventory (PAI; 02/10/2022) Patient Health Questionnaire-9 (PHQ-9)  Generalized Anxiety Disorder-7 (GAD-7) PTSD Checklist for DSM-5 (PCL-5; 02/10/2022)   BACKGROUND INFORMATION AND PRESENTING PROBLEM: Cheryl Sanders is a 46 year old female who resides in [place], [place]  [name] reported she is pursuing an ADHD evaluation secondary to her son having been diagnosed with ADHD five years ago, noting she has experienced similar difficulties since childhood but "did not realize [during childhood]" that "some of what [she] did was abnormal." She added how she is "becoming more aware of [ADHD] and how it affects [her son]" as well as how she is experiencing increasing employment issues at her employment as it becomes more "cognitively demanding" and the "repercussions [for problems] becoming more severe," which she attributed to ADHD-related difficulties. She stated she is "seeking a [ADHD] diagnosis "so she can "better manage it." She described her ADHD-related concerns as including indecisiveness and needing to be in the "right head space" to start tasks; tending to underestimate how long a task will take and losing track of time when hyperfixated on a task, which can negatively impact her ability to complete other priorities; being easily distracted by various stimuli (e.g., lights, sounds, and thoughts); efforts to "mask" her ADHD-related symptoms (e.g., trying to "make an  appropriate amount of eye contact," "responding appropriately," and/or withdrawning in social interactions) that require significant effort and make social interactions fatiguing; frequently interrupting others if she is "excited" by the conversation or does not want to forget what she wants to say; making careless mistakes that others (e.g., supervisors and teachers) have commented on; difficulty waiting; others complain she is not listening; problems maintaining an organization system that most others view as organized; trouble planning and largely relying on others to do so; regularly being forgetful (e.g., misplacing items, leaving beverage cups throughout her home, and missing appointments and events); frequently fidgeting and squirming (e.g., "playing with her hands," rotating her ring, tapping her feet, changing seated positions, and "playing with items") as well as feeling restless and occasionally leaving her seat when she has to stay seated; task initiation (e.g., delaying starting tasks that require cognitive effort), task maintenance (e.g., becoming distracted by other tasks and starting them before finishing the initial task), and task disengagement (e.g., hyperfixation on certain tasks to the detriment of others); alternating between impulsive decision making that leads to decisions she later regrets or "overthinking" decisions when experiencing anxiety; "putting off" sleep due to difficulty disengaging from a task she enjoys, her mind being "more active" when trying to fall asleep, and talking and moving in her sleep, although she noted she generally feels rested upon waking; driving much faster than others as she is often "not paying attention to [her] speed," which has resulted in nine-to-eleven speeding tickets throughout her life; having a hard time following proper order of tasks and needing to frequently re-read directions if there were "more than two steps;" commonly being late to events; and  experiencing irritability if she is required to wait or "sit still" for extended periods, repeat herself, or shift her attention. She also described experiencing "sensory issues" (e.g., light sensitivity, feeling overstimulated when there are lots of sounds, and avoidance of wearing layers of clothes or clothes with "itchy" tags) and difficulty in social interactions; however, she shared she does not avoid social settings or experience social deficits (i.e., her husband mentions she is "good at talking with others" but it requires a lot of masking behaviors and effort for her to do so). Additionally, she discussed a belief she may have autism spectrum  disorder (ASD) and dyscalculia as she had positive screenings when taking online screeners that included the RAADS-R, an extended history of social difficulties (e.g., difficulty "understanding the obvious" of what others mean, fixation on specific details of conversations [e.g., word choice and context], and researching body language in an attempt to understand it, not considering emotions when making decisions, and having "special interests" she can talk about at length and can lead to other's "eyes glazing over. She also discussed how past treatment by childhood classmates has led to avoidance or distress in certain settings (e.g., malls and high schools), hypervigilance-related symptoms, flashbacks, and "physical responses" to trauma-related reminders; an extended history of periods of low mood with self-critical thoughts, lowered self-worth, occasional suicidal ideation without plan or intent; anxiety secondary to ADHD-related difficulties, other's perceptions of her, and concerns about her children's wellbeing; and use of self-harm behaviors (e.g., cutting and burning) during college, her last instance of suicidal ideation being 18 years ago, and confidence in her ability to keep herself safe. She expressed a belief her ADHD-related concerns are independent of  mood, but anxiety can exacerbate them. She stated her coping strategies include doing less cognitively demanding tasks when having a hard time starting a cognitively demanding task, having others proofread her work, finding an organization she can maintain, utilizing others for areas of weakness, and putting her eyeglasses in same place.  Ms. Thaden described having gotten in trouble for "talking a lot" during class but became "more withdrawn" due to others "viewing [her] as different" and occasionally teasing her, although noted her relationship with classmates was overall "good" and she had friends she "viewed as safe." She discussed having gotten in trouble for "talking a lot" during class as a young child but became "more withdrawn" due to other's perceptions of her behavior. 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 She further reported she eventually "dropped out" due to a pregnancy, having a low GPA, and various stressors, but later returned to college and obtained a master's degree in [employer]. 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 She discussed she is married and has two children and is currently in counseling with her husband for "communication issues" and "codependency" which is "going pretty well" but has its "ups and downs."   Ms. Sanders reported her medical history is unremarkable. She denied having ever experienced head injury or seizures. She stated social use of alcohol that involves one-to-two standard size drinks a month, daily use of pack of cigarettes, and four-to-six cups of coffee a day that can contribute to sleep onset issues but is also able to "take a nap after drinking [caffeine]." She further stated having a legal history that includes court appearances for  speeding tickets and "forgetting to pay her car registration." She denied ever experiencing obsessions or compulsions or homicidal ideation, plan, or intent. She described her familial mental health history as significant for ADHD (son), depression and "head injury" (mother), anxiety (father), and possible ADHD (father and sister).  BEHAVIORAL OBSERVATIONS: Ms. Chern presented on time for the evaluation. She was well-groomed. She was oriented to time, place, person, and purpose of the appointment. During the evaluation Ms. Sanders verbalized self-doubt and concerns about the clarity of her verbally presented information (e.g., "Probably a more succinct way of saying it,"  having this evaluator repeat a question after she had correctly answered it, and using multiple examples to try to further portray the idea or concept she was describing) as well as working memory-related difficulties (e.g., "I lost [the numbers]" and regularly repeating numbers in verbally presented arithmetic tasks to herself). Throughout the course of the evaluation, she maintained appropriate eye contact. Her thought processes and content were logical, coherent, and goal-directed. There were no overt signs of a thought disorder or perceptual disturbances, nor did she report such symptomatology. There was no evidence of paraphasias (i.e., errors in speech, gross mispronunciations, and word substitutions), repetition deficits, or disturbances in volume or prosody (i.e., rhythm and intonation). Overall, based on Ms. Sanders's approach to testing, the current results are believed to be a good estimate of her abilities.  PROCEDURAL CONSIDERATIONS:  Psychological testing measures were conducted through a virtual visit with video and audio capabilities, but otherwise in a standard manner.   The Wechsler Adult Intelligence Scale, Fourth Edition (WAIS-IV) was administered via remote telepractice using digital stimulus materials  on [employer] Q-global system. The remote testing environment appeared free of distractions, adequate rapport was established with the examinee via video/audio capabilities, and Ms. Sanders appeared appropriately engaged in the task throughout the session. During the Digit Span subtest, an audio issue occurred that required this writer to repeat one of the early tasks on Digit Span Forward. No other significant technological problems or distractions were noted during administration. Modifications to the standardization procedure included: none. The WAIS-IV subtests, or similar tasks, have received initial validation in several samples for remote telepractice and digital format administration, and the results are considered a valid description of Ms. Sanders's skills and abilities.  CLINICAL FINDINGS:  COGNITIVE FUNCTIONING  Wechsler Adult Intelligence Scale, Fourth Edition (WAIS-IV):  Ms. Spackman completed subtests of the WAIS-IV, a full-scale measure of cognitive ability. She completed subtests of the WAIS-IV, a full-scale measure of cognitive ability. The WAIS-IV is comprised of four indices that measure cognitive processes that are components of intellectual ability; however, only subtests from the Verbal Comprehension and Working Memory indices were administered. As a result, Full-Scale-IQ (FSIQ) and General Ability Index (GAI) were unable to be determined. She presents with similar abilities.  WAIS-IV Scale/Subtest IQ/Scaled Score 95% Confidence Interval Percentile Rank Qualitative Description  Verbal Comprehension (VCI) 116 110-121 86 High Average  Similarities 11     Vocabulary 14     Information 14     Working Memory (WMI) 114 106-120 82 High Average  Digit Span 11     Arithmetic 14       The Verbal Comprehension Index (VCI) provides a measure of one's ability to receive, comprehend, and express language. It also measures the ability to retrieve previously learned  information and to understand relationships between words and concepts presented orally.  Ms. Ospina obtained a VCI scaled score of 116 (86th percentile) placing her in the high average range compared to same-aged peers. Her performance on the subtests comprising this index was diverse. Out of the three subtests, Ms. Sanders demonstrated the lowest performance on the Similarities subtest, which examined her ability to abstract meaningful concepts and relationships from verbally presented material.   The Working Memory Index ([place]) provides a measure of one's ability to sustain attention, concentrate, and exert mental control. Ms. Jacquin obtained a WMI scaled score of 114 (82nd percentile), placing her in the Average range compared to same-aged peers.  Ms. Scantling score on the Arithmetic subtest is higher than her score  on Digit Span, which may indicate specific strengths in arithmetic computational skills rather than a general proficiency in working memory.   ATTENTION AND PROCESSING  CNS Vital Signs: The CNS Vital Signs assessment evaluates the neurocognitive status of an individual and covers a range of mental processes. The results of the CNS Vital Signs testing indicated low average neurocognitive processing ability and an overall valid profile. Regarding attention, simple attention and complex attention were in the average range and sustained attention was a relative weakness in the low average range; however, sustained attention was flagged as potentially invalid. Executive functioning and cognitive flexibility were in the low range. Working memory was average although flagged as potentially invalid. Psychomotor speed and motor speed were low. Processing speed and reaction time were average. Visual memory (images) was average and verbal memory (words) was low, which indicates visual memory is a strength. The results suggest Ms. Sanders experiences verbal memory,  psychomotor speed, cognitive flexibility, executive functioning, and motor speed impairment.   Domain  Standard Score Percentile Validity Indicator Guideline  Neurocognitive Index 84 14 Yes Low Average  Composite Memory 76 5 Yes Low  Verbal Memory 62 1 Yes Very Low  Visual Memory 98 45 Yes Average  Psychomotor Speed 78 7 Yes Low  Reaction Time 100 50 Yes Average  Complex Attention 91 27 Yes Average  Cognitive Flexibility 77 6 Yes Low  Processing Speed  98 45 Yes Average  Executive Function 77 6 Yes Low  Working Memory 90 25 No Average  Sustained Attention 80 9 No Low Average  Simple Attention 106 66 Yes Average  Motor Speed 74 4 Yes Low   EXECUTIVE FUNCTION  Behavior Rating Inventory of Executive Function, Second Edition (BRIEF-A):  Ms. Pinkhasov completed the Self-Report Form of the Behavior Rating Inventory of Executive Function-Adult Version (BRIEF-A), which has three domains that evaluate cognitive, behavioral, and emotional regulation, and a Global Executive Composite score provides an overall snapshot of executive functioning. There are no missing item responses in the protocol.  The Negativity, Infrequency, and Inconsistency scales are not elevated, suggesting she did not respond to the protocol in an overly negative, haphazard, extreme, or inconsistent manner. In the context of these validity considerations, ratings of Ms. Sanders's everyday executive function suggest some areas of concern. The overall index, the Global Executive Composite (GEC), was elevated (GEC T = 82, %ile = >99). Both the Behavioral Regulation (BRI) and the Metacognition (MI) Indexes were elevated (BRI T = 69, %ile = 98 and MI T = 87, %ile = >99). Ms. Lehrman indicated difficultly with her ability to inhibit impulsive responses, adjust to changes in routine or task demands, monitor social behavior, initiate problem solving or activity, sustain working memory, plan and organize problem-solving  approaches, attend to task-oriented output, and organize environment and materials. She did not describe her ability to modulate emotions as problematic.  Ms. Suppa elevated scores on the Inhibit scale as well as the Behavioral Regulation and the Metacognition Indexes, suggest she is perceived as having poor inhibitory control and/or suggest more global behavioral dysregulation is having a negative effect on active metacognitive problem solving.  Scale/Index  Raw Score T Score Percentile  Inhibit 19 76 99  Shift 14 75 >99  Emotional Control 14 49 65  Self-Monitor 14 73 99  Behavioral Regulation Index (BRI) 61 69 98  Initiate 22 87 >99  Working Memory 22 90 >99  Plan/Organize 22 74 98  Task Monitor 15 80 >99  Organization of Materials 22 80 >  99  Metacognition Index (MI) 103 87 >99  Global Executive Composite (GEC) 164 82 >99   Validity Scale Raw Score Cumulative Percentile Protocol Classification  Negativity 2 0 - 98.3 Acceptable  Infrequency 0 0 - 97.3 Acceptable  Inconsistency 3 0 - 99.2 Acceptable   BEHAVIORAL FUNCTIONING   Patient Health Questionnaire-9 (PHQ-9): Ms. Rosenau completed the PHQ-9, a self-report measure that assesses symptoms of depression. She scored 1/27, which indicates minimal depression.   Generalized Anxiety Disorder- 7 (GAD-7): Ms. Klani Caridi completed the GAD-7, a self-report measure that assesses symptoms of anxiety. She scored 2/21, which indicates minimal anxiety.   Adult ADHD Self-Report Scale Symptom Checklist (ASRS): Ms. Zech reported the following symptoms as sometimes: feeling overly active and compelled to do things and difficulty waiting for turn in turn taking situations. She endorsed the following symptoms as occurring often: difficulty getting things in order when a task requires organization, avoiding or delaying getting started on tasks requiring a lot of thought, being distracted by noise around her, leaving their  seat when expected to stay seated, feeling restless or fidgety, difficulty relaxing, talking too much in social situations, and interrupting others or finishing their sentences.  She endorsed the following symptoms as very often: difficulty wrapping up final details of a project following the completion of challenging aspects, problems remembering appointments or obligations, fidgeting or squirming, making careless mistakes when working on boring or difficult projects, struggling to sustain attention when doing boring or repetitive work, struggling to concentrate on what people say to them even when they are speaking directly to them, misplacing or has difficulty finding things, and interrupting others when they are busy. Endorsement of at least four items in Part A is highly consistent with ADHD in adults. The frequency scores of Part B provides additional cues. Ms. Bartram scored a 5/6 on Part A and 11/12 on Part B, which is considered a positive screening for ADHD.   PTSD Checklist for DSM-5 (PCL-5): The PCL-5 was administered. Ms. Skowron scored a 23/80. They endorsed repeated, disturbing, and unwanted memories of the stressful experience (a little bit); repeated, disturbing dreams of the stressful experience (a little bit); flashbacks (a little bit); feeling very upset when something reminds you of the stressful experience (moderately); having strong physical reactions when something reminds you of the stressful experience (moderately); avoiding memories, thoughts, or feelings related to the stressful experience (moderately); avoiding external reminders of the stressful experience (quite a bit); trouble remembering important parts of the stressful experience (a little bit); having strong negative beliefs about yourself, other people, or the world (moderately); blaming yourself or someone else for the stressful experience or what happened after it (moderately); having strong negative feelings such  as fear, horror, anger, guilt, or shame (a little bit); loss of interest in activities you used to enjoy (not at all); feeling distant or cut off from other people (a little bit); trouble experiencing positive feelings (a little bit); irritable behavior, angry outbursts, or acting aggressively (not at all); taking too many risks or doing things that could cause you harm (not at all); being superalert or watchful or on guard (a little bit); feeling jumpy or easily startled (a little bit); having difficulty concentrating (a little bit); and trouble falling or staying asleep (not at all).   Personality Assessment Inventory (PAI): The PAI is an objective inventory of adult personality. The validity indicators suggest Ms. Sanders's profile is interpretable (ICN T = 43, INF T = 47, NIM T = 44, and PIM  T = 52)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

## 2022-04-03 DIAGNOSIS — F411 Generalized anxiety disorder: Secondary | ICD-10-CM | POA: Diagnosis not present

## 2022-04-03 DIAGNOSIS — F900 Attention-deficit hyperactivity disorder, predominantly inattentive type: Secondary | ICD-10-CM | POA: Diagnosis not present

## 2022-04-17 DIAGNOSIS — F411 Generalized anxiety disorder: Secondary | ICD-10-CM | POA: Diagnosis not present

## 2022-04-17 DIAGNOSIS — F900 Attention-deficit hyperactivity disorder, predominantly inattentive type: Secondary | ICD-10-CM | POA: Diagnosis not present

## 2022-05-08 DIAGNOSIS — F411 Generalized anxiety disorder: Secondary | ICD-10-CM | POA: Diagnosis not present

## 2022-05-08 DIAGNOSIS — F900 Attention-deficit hyperactivity disorder, predominantly inattentive type: Secondary | ICD-10-CM | POA: Diagnosis not present

## 2022-06-26 DIAGNOSIS — F411 Generalized anxiety disorder: Secondary | ICD-10-CM | POA: Diagnosis not present

## 2022-06-26 DIAGNOSIS — F900 Attention-deficit hyperactivity disorder, predominantly inattentive type: Secondary | ICD-10-CM | POA: Diagnosis not present

## 2022-07-04 ENCOUNTER — Ambulatory Visit (INDEPENDENT_AMBULATORY_CARE_PROVIDER_SITE_OTHER): Payer: BC Managed Care – PPO | Admitting: Psychology

## 2022-07-04 ENCOUNTER — Encounter: Payer: Self-pay | Admitting: Psychology

## 2022-07-04 DIAGNOSIS — F902 Attention-deficit hyperactivity disorder, combined type: Secondary | ICD-10-CM | POA: Diagnosis not present

## 2022-07-04 NOTE — Progress Notes (Signed)
                Detroit Frieden, PhD 
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
## 2022-07-05 DIAGNOSIS — R928 Other abnormal and inconclusive findings on diagnostic imaging of breast: Secondary | ICD-10-CM | POA: Diagnosis not present

## 2022-07-05 DIAGNOSIS — N6002 Solitary cyst of left breast: Secondary | ICD-10-CM | POA: Diagnosis not present

## 2022-07-10 DIAGNOSIS — F411 Generalized anxiety disorder: Secondary | ICD-10-CM | POA: Diagnosis not present

## 2022-07-10 DIAGNOSIS — F900 Attention-deficit hyperactivity disorder, predominantly inattentive type: Secondary | ICD-10-CM | POA: Diagnosis not present

## 2022-07-17 ENCOUNTER — Encounter: Payer: Self-pay | Admitting: Psychology

## 2022-07-17 ENCOUNTER — Ambulatory Visit: Payer: BC Managed Care – PPO | Admitting: Psychology

## 2022-07-17 DIAGNOSIS — F89 Unspecified disorder of psychological development: Secondary | ICD-10-CM

## 2022-07-17 DIAGNOSIS — F902 Attention-deficit hyperactivity disorder, combined type: Secondary | ICD-10-CM

## 2022-07-17 NOTE — Progress Notes (Signed)
[facility] Counselor/Therapist Progress Note  Patient ID: Cheryl Sanders, MRN: [id number],    Date: 07/17/2022  Time Spent: 8-11 am    Treatment Type: Testing  Met with patient for testing session.  Patient was at the clinic and session was conducted from therapist's office in person.  Reported Symptoms: Reason for Visit /Presenting Problem: Patient presents significant social interaction difficulty along with instances of repetitive speech, overly intense or specific interests, difficulty coping with change and sensory hypersensitivity.  Patient was recently diagnosed with ADHD, but believes that she may have autism spectrum disorder (ASD) as well. Testing recommended to evaluate for ASD along with other conditions that may be affecting social interaction and behavior.     Mental Status Exam: Appearance:  Neat and Adequately Groomed - hair not done no makeup.    Behavior: Appropriate  Motor: Normal  Speech/Language:  Clear and Coherent and Normal Rate  Affect: Appropriate  Mood: normal  Thought process: Overly elaborate trouble expressing emotions  Thought content:   WNL  Sensory/Perceptual disturbances:   WNL  Orientation: oriented to person, place, time/date, and situation  Attention: Fair  Concentration: Good  Memory: WNL  Fund of knowledge:  Good  Insight:   Fair  Judgment:  Good  Impulse Control: Good   Risk Assessment: Danger to Self:  No Self-injurious Behavior: No Danger to Others: No  Behavior Observations: Patient was cooperative and displayed good effort. Attention and concentration were adequate overall, although patient exhibited a few instances each of self-correction and asking questions to be repeated.  Mood was mildly anxious with appropriate affect.  Patient was socially responsive and occasionally showed interest in the examiner's experiences but was often overly elaborate and detailed with her responses.  The results appear  representative of current functioning.    Subjective: Testing included the K-BIT-2R (0.75 hrs. for testing and scoring) along with the ADOS-2 Module 4 (2 hrs.) and SRS-2 (0.25 hrs.).   Patient's hisband was also sent the SRS-2 to complete online.  Diagnosis:Unspecified disorder of psychological development  Attention deficit hyperactivity disorder (ADHD), combined type  Plan: Testing complete. Report writing to be conducted followed by interactive feedback next session.    Rainey Pines, PhD

## 2022-07-17 NOTE — Progress Notes (Signed)
                Benjamine Strout, PhD 

## 2022-07-18 ENCOUNTER — Encounter: Payer: Self-pay | Admitting: Psychology

## 2022-07-18 NOTE — Progress Notes (Unsigned)
Emanuel Campos NEAVE-Johnson is a 46 y.o. female patient ***.     Collaboration of Care: {BH OP Collaboration of Care:[id number]}  Patient/Guardian was advised Release of Information must be obtained prior to any record release in order to collaborate their care with an outside provider. Patient/Guardian was advised if they have not already done so to contact the registration department to sign all necessary forms in order for [place] to release information regarding their care.   Consent: Patient/Guardian gives verbal consent for treatment and assignment of benefits for services provided during this visit. Patient/Guardian expressed understanding and agreed to proceed.    Rainey Pines, PhD

## 2022-07-18 NOTE — Progress Notes (Signed)
Cheryl Sanders is a 46 y.o. female patient. Report writing competed ( 2 hrs.).  Interactive feedback to be conducted next session. Report to be attached to the feedback progress note.  Patient/Guardian was advised Release of Information must be obtained prior to any record release in order to collaborate their care with an outside provider. Patient/Guardian was advised if they have not already done so to contact the registration department to sign all necessary forms in order for [place] to release information regarding their care.   Consent: Patient/Guardian gives verbal consent for treatment and assignment of benefits for services provided during this visit. Patient/Guardian expressed understanding and agreed to proceed.    Rainey Pines, PhD

## 2022-07-24 DIAGNOSIS — F411 Generalized anxiety disorder: Secondary | ICD-10-CM | POA: Diagnosis not present

## 2022-07-24 DIAGNOSIS — F900 Attention-deficit hyperactivity disorder, predominantly inattentive type: Secondary | ICD-10-CM | POA: Diagnosis not present

## 2022-08-06 ENCOUNTER — Ambulatory Visit: Payer: BC Managed Care – PPO | Admitting: Psychology

## 2022-08-06 ENCOUNTER — Encounter: Payer: Self-pay | Admitting: Psychology

## 2022-08-06 DIAGNOSIS — F411 Generalized anxiety disorder: Secondary | ICD-10-CM | POA: Diagnosis not present

## 2022-08-06 DIAGNOSIS — F902 Attention-deficit hyperactivity disorder, combined type: Secondary | ICD-10-CM

## 2022-08-06 DIAGNOSIS — F840 Autistic disorder: Secondary | ICD-10-CM | POA: Diagnosis not present

## 2022-08-06 NOTE — Progress Notes (Signed)
[facility] Counselor/Therapist Progress Note  Patient ID: MELODI HAPPEL, MRN: [id number],    Date: 08/06/2022  Time Spent: 9-9:45 am   Treatment Type:  Testing - Feedback Session  Met with patient to review results of testing.  Patient was at home and session was conducted from therapist's office via video conferencing.  Patient verbally consented to telehealth.       Reported Symptoms: Patient presents significant social interaction difficulty along with instances of repetitive speech, overly intense or specific interests, difficulty coping with change and sensory hypersensitivity.  Patient was recently diagnosed with ADHD, but believes that she may have autism spectrum disorder (ASD) as well. Testing recommended to evaluate for ASD along with other conditions that may be affecting social interaction and behavior.      Subjective: Interactive feedback was conducted (1 hr.).  It was discussed how patient met the criterion for Autism Spectrum Disorder and ADHD along with how these conditions affect her ability to regulate emotion and relate to others.  Recommendations included discussing results with PCP, developing a visual organization system, and continuing individual and marital counseling to help build social communication and coping skills.  Patient expressed agreement with the results and recommendations.     Total Time of Testing: 6 hrs. Testing and Scoring: 3 hrs. Interactive Feedback:1 hr. Report Writing: 2 hrs.     Diagnosis:Autism spectrum disorder  Attention deficit hyperactivity disorder (ADHD), combined type  Generalized anxiety disorder  Plan: Report to be sent to parent and referring provider.   Rainey Pines, PhD

## 2022-08-06 NOTE — Progress Notes (Signed)
Psychological Testing Report - Confidential  Identifying Information:               Patient's Name:   Cheryl Sanders  Date of Birth:   1976-04-26     Age:                46 years  MRN#:                                   [id number]      Date of Assessment:              July 17, 2022         Purpose of Evaluation:  The purpose of the evaluation is to provide diagnostic information and treatment recommendations.     Referral Information: [name] was a 46 year old Caucasian female referred for testing by Cheryl Sanders, Ph.D. regarding problems with social interaction and atypical behavior.  [name] stated that she completed an assessment for Attention Deficit Hyperactivity Disorder (ADHD) during May 2023 with Dr. Dewaine Sanders, who recommended further testing after [name] reported impairment with meeting demands at work along with disconnecting from others emotionally and socially.  She suspects having Autism Spectrum Disorder. (ASD).        Relevant Background Information:  Developmental history was reported to be significant for motor, speech, and social delays.  Early delays were reported regarding walking (2 years) and speech.  [name] participated in speech therapy during her early childhood years.  She was very shy, and her older sister often spoke or translated for her up through her teen years.  She Didn't understand social rules until she was much older.  Regarding current development,  motor skills were reported to be adequate for overall coordination but frequent clumsiness.  She struggles with body-space relations, is not athletic, runs into doorways, and hits head on objects accidentally.  [name] likes to draw, and her fine motor skills were reported to be well developed.  Current speech was reported to be typical, although [name] indicated having trouble expressing her thoughts effectively which frustrates her and  interferes with her participation in couples' therapy.   Self-care skills were reported to be good, although she indicated having a "low maintenance" hygiene routine.  Regarding independent skills, [name] struggles keeping up with household chores and bills.  She has difficulty with organizational tasks at work which leads to frequent mistakes and emotional exhaustion.  She often forgot to pay bills.  Socially, [name] reported having friendships, most of whom are currently coworkers.  She has trouble maintaining friendships except with those who don't mind her maintaining infrequent contact.  She reported currently struggling in her relationship with her husband, as he wants to interact with her after she gets home from work, and she would rather spend time alone.  [name] stated that she prefers to engage with others through activities rather than social conversation.      Medical history was reported to be significant for advanced maternal age in pregnancy (35 years), Anxiety, GBS carrier, Varicella, Smoking history, and a mass in her breast.  Past Surgical History includes a cesarean Sanders, dilation and curettage of uterus, and mandible surgery.  Medication history includes cetirizine (ZYRTEC) 10 MG, citalopram (CELEXA) 10 MG, fluticasone (FLONASE) 50 MCG/ACT and Hydroxyzine (ATARAX/VISTARIL) 10 MG.  [name] reported that she stopped taking the Celexa during 2020.  Seasonal allergies seasonal only. Current substance use consists of occasional alcohol use.  The consumption was reported to be not addictive or interfering.  Previous psychological history is significant for ADHD, anxiety, and depression. [name] is currently working with a marital therapist for her relationship.  She expressed being open to individual psychotherapy when no longer participating in couples counseling.  A history of Psychiatric Hospitalization was denied.  She was evaluated and  diagnosed with ADHD by Dr. Dewaine Sanders in May 2023.  Per dr. Karmen Sanders report, "[name] was administered assessments to measure her current cognitive abilities. Her verbal comprehension abilities were in the high average range. Her lowest performance was on the on the Similarities subtest, which examined her ability to abstract meaningful concepts and relationships from verbally presented material. Her ability to sustain attention, concentrate, and exert mental control was also in the high average range, although her Arithmetic subtest was higher than her score on Digit Span, which may indicate specific strengths in arithmetic computational skills rather than a general proficiency in working memory. Moreover, she demonstrated working memory-related difficulties throughout the [employer] administration (e.g., stating "I lost [the numbers]" and often repeating numbers in verbally presented arithmetic tasks to herself aloud). Results of the CNS Vital Signs indicated a low average neurocognitive processing ability. She demonstrated impairment in verbal memory, psychomotor speed, cognitive flexibility, executive functioning, and motor speed impairment."                         Educationally, [name] reported that she earned her Masters degree in [employer], along with an undergraduate degree in Art History.  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  She uses a Pensions consultant and task lists to keep up with work.  [name] reported that she can do public appearances, but they are exhausting for her.  Leisure activities include genealogy, crafting, puzzles, gardening, pets (cats and chickens), timing and scoring of auto racing.  [name] is currently living with her spouse and children ages:13 and 10 years. Her husband has two other children from a previous marriage who do not live in the home.   Good relations were reported with her children other than trouble communicating with her oldest son.  Her younger son is very affectionate.  Relations with her family have been closer in general since starting therapy.  Ms. Burdell has been married for 14 years.  Her husband Cheryl Sanders is also a Comptroller.  He frequently worries about she feels about him due to [name] not being emotionally expressive.  Her husband has been supportive with the therapy process and learning about her ADHD.  They are starting to understand each other better.  Ms. Dowson stated that her husband tries to support her but often does not do so in ways that she needs.  She receives support from her coworkers whom she supervises.  Family mental health history is significant for ADHD, Panic Disorder, and Alzheimer's Disease.  Childhood/adolescent history was reported to be significant for receiving frequent bullying teasing throughout primary and secondary school.  Current stressors marital conflict and occupational concerns.  [name] stated that she is her own barrier to success, often internalizing others' voices on how she should do something.  She frequently tells herself that she is not good enough to be successful.  She often compares herself to others, especially when makes  mistakes.  She has trouble with goal setting and lacks confidence in herself.       Presenting Symptomology:  [name] reported that she struggles falling asleep related to over thinking or distraction.  She denied recent changes in appetite.  Energy during the day was reported to be typical, without recent changes.  Recent episodes of sadness or depression were also denied, having had problems with this during her 20's.  She felt like she was failing at being an adult at that time.  [name] reported struggling with anxiety in her younger years, especially after her mother died.  Her most recent panic attack  was 10 years ago, but she still has random fears, such as suspecting cat having rabies in 2017.  She still has anxiety now triggered by thoughts of failing as an adult, her mother's passing, deadlines, presenting in front of others, appearing normal, communication difficulties, and harm coming to her children.  She denied obsessive thought or compulsive behavior other than checking the emergency break multiple times.  [name] reported having some trouble paying attention, being easily distracted, and frequently losing or forgetting.  She reported developed some organizational tools but still struggling.  [name] reported being frequently restless/fidgety and interrupting but not acting impulsively.  [name] stated that she struggles with maintaining friendships.  She adequately interprets social emotional cues, but it takes a concerted effort to do so.  Repetitive speech or behavior was denied, although she will sometimes repeat what she had seen or heard in a movie.  She will talk excessive about specific topics, usually genealogy and libraries.  She becomes upset when plans change but stated that she can handle them.  [name] stated that she is sensitive to sensory stimulation in general, indicating that she gets overwhelmed with high levels of sensation, particularly when it's unexpected.  This can be with taste, noise, sunlight, and touch.  Her high sensitivity to touch was reported to make intimate relations with her husband difficult, which has been a big struggle in their relationship.                                  Procedures Administered: Carlos American 7  9 Client: Abeeha L. Cheryl Sanders       DOB:  May 19, 1976         MRN# [id number] [facility] Medicine [address] [place], [place], [address] Brief Intelligence Test - 2 ADOS 2 Module 4 7  9  Client: Cheryl Sanders       DOB:  [birth year]         MRN# [id number] [facility]  Medicine [address] [place], [place], [place] Social Responsiveness Scale 2 7  9  Client: Cheryl Sanders       DOB:  1976/06/26         MRN# Romie Minus [facility] Medicine [address] [place], [address], [place] - Self and Other Report  Behavioral Observations:  Ms. Duca was cooperative and displayed good effort. Attention and concentration were adequate overall, although she exhibited a few instances each of self-correction and asking questions to be repeated.  Mood was mildly anxious with appropriate affect.  Ms. Griffitts was socially responsive and occasionally showed interest in the examiner's experiences but was often overly elaborate and detailed with her responses.  The results appear representative of current functioning.  Ms. Littrell was casually dressed and adequately groomed, although her  hair did not seem styled.  Brief mental status indicated typical general orientation and alertness.  Memory was seemed intact.  Judgement was good while social insight appeared fair.  Hallucinations, delusions, and thoughts of self-harm were denied.    Test Results and Interpretation:   General Intellectual Functioning: Carlos American Brief Intelligence Test - 2 Composite Score Summary  Composite Scores  Sum of Raw Scores Composite Score Percentile Rank 90% Confidence Interval Qualitative Description  Verbal Comprehension  VC          98    116  86  110-120  Above Average  Nonverbal Reasoning PR 42   113 81 108-117 High Average  Composite IQ  FSIQ -   115 84 111-119 High Average   K-BIT 2 Continued Domain Subtest Name  Total Raw Score Scaled Score Percentile Rank  Verbal Verbal Knowledge VK 57 14 91  Comprehension Riddles Ri 41 12 75   The K-BIT 2 was used to assess [name]'s performance across two areas of cognitive ability.  Ms. Rumberger' Composite IQ score fell within the high average range when compared to same age peers  (CIQ = 115).  Ms. Salsbury performance was consistent across the Primary Index Scores, as Verbal Comprehension (VCI = 116) was equally developed with Perceptual Reasoning (PRI = 113).  This indicates age typical language understanding and visual learning ability.  On individual subtests, [name] performed within the high range for verbal knowledge, with high average inferential thinking (riddles), and visual pattern analysis (matrices).  This suggests well developed abstract verbal and nonverbal reasoning.     Behavioral - Emotional Functioning: Regarding symptoms of ASD, information from the ADOS-2 Module 4 indicated difficulty with some aspects of social communication and reciprocal social interaction, although little difficulty with restricted repetitive behavior was observed during this administration.  Within the area of communication, [name] spoke in complete sentences.  There was typical variation in her tone of voice and there was no observation of echolalia or repetitive speech.  She adequately reported events and frequently offered spontaneous personal information.  Ms. Rainelle responded adequately to personal information from the examiner but only occasionally asked socially related questions.  Reciprocal conversation appeared typical at times with excessive elaboration and detail during other times.  She demonstrated adequate use of spontaneous descriptive, emphatic, and emotional gestures.  Socially, [name] exhibited poorly modulated eye contact.  While her range of facial expression seemed adequate, she infrequently directed facial expression to the examiner.  Her expression of enjoyment in interaction appeared adequate during discussion of certain topics (pets and a recent trip).  Ms. Starzyk exhibited some difficulty describing personal emotions along with spontaneously identifying few emotions in others during pictures or stories.  Ms.  Coke also demonstrated mild difficulty describing the nature of social relationships but could identify her own role in these relationships.  Her engagement in adult independent activities seemed below age typical.  Ms. Klatt occasionally initiated social interaction but limited to personal interests.  Her responsiveness to social advances was frequent and socially appropriate.  Rapport was difficult to maintain due to limited social reciprocity.  Ms. Colquitt demonstrated typical imagination and creativity in her responses and storytelling.  Regarding behavior, [name] did not demonstrate any odd sensory seeking behavior, odd movement, or self-injury.  Compulsive behavior was not observed.  While [name] often spoke with excessive elaboration about her interests and concerns in general, she did not speak excessively about her more specific interests (genealogy, auto racing, libraries).  Current functioning regarding ASD related symptoms reported during daily activities was assessed using the Social Responsiveness Scale - 2.  [name] was the informant for this measure.  On this measure, [name]'s T Score of 77 was in Severe range. Scores in this range indicate deficiencies in reciprocal social behavior that are clinically significant and lead to severe and enduring interference with everyday social interactions.  Such scores are strongly associated with clinical diagnosis of an autism spectrum disorder.               T              Awr            Cog           Com           Mot           RRB       Raw score        13              19               40             26               20       T-score             69              70               76             81               73  Awr = Social Awareness    Com = Social Museum/gallery exhibitions officerCommunication Cog = Social Cognition     Mot = Social Motivation  RRB = Restricted Interests and Repetitive Behavior     DSM-5  Compatible Subscales Raw score T-score  Social Communication and Interaction         98    78 Severe  Restricted Interests and Repetitive Behavior         20    73 Moderate    [name]'s husband, Cheryl SectionBrian Johnson, also competed the SRS-2.  On this measure, [name]'s score of 65 was in the Mild range. Scores in this range indicate deficiencies in reciprocal social behavior that are clinically significant and may lead to mild to moderate interference with everyday social interactions.  Such scores are typical for individuals with autism spectrum disorders of mild severity.               T              Awr            Cog           Com           Mot           RRB     Raw score         10               15               28              16               15  T-score              61               63               65              64              65  Awr = Social Awareness    Com = Social Communication Cog = Social Cognition     Mot = Social Motivation RRB = Restricted Interests and Repetitive Behavior  DSM-5 Compatible Subscales Raw score T-score  Social Communication and Interaction         69    65   Mild  Restricted Interests and Repetitive Behavior         15    65  Mild     Summary:   Ms. Prest was evaluated during October 2023 related to atypical behavior and social interaction difficulty.  Ms. Shanafelt presents significant social interaction difficulty along with instances of repetitive speech, overly intense or specific interests, difficulty coping with change and sensory hypersensitivity.  She was recently diagnosed with ADHD but believes that she may have Autism Spectrum Disorder (ASD) as well. Testing was recommended to evaluate for ASD along with other conditions that may be affecting social interaction and behavior.  Test results indicated high average overall intelligence (K-BIT 2), with equally developed inferential thinking and nonverbal reasoning.  this is  consistent with Dr. Karmen Sanders testing measuring verbal comprehension and working memory.  Neurocognitive skills testing from recent neuropsychological testing (May 2023) indicated low average neurocognitive processing ability with impairment in verbal memory, psychomotor speed, cognitive flexibility, executive functioning, and motor speed.  Testing for Autism Spectrum indicated observed difficulty with several aspects of social communication without obvious restricted repetitive behavior, although [name] often elaborated excessively with greater detail than needed for typical social conversation.  However, self-report ratings indicated a greater level of ASD related behavior regarding both social communication and restricted repetitive behavior.  Self-report ratings indicated severe social communication deficit with moderately high restricted-repetitive behavior, while ratings from her husband indicated mild elevations in these areas just below the moderate level.  This is consistent with female who have ASD being more effective at masking social impairments and atypical behavior than their female counterparts.  Ms. Kloeppel appears to meet the criterion for ASD, based on developmental history, testing, and rating along with her recent diagnosis of ADHD.  These two conditions likely result in [name]'s excessive worry and difficulty maintaining relationships.  Recommendations include discussing results with Primary Care Physician or psychiatrist while seeking individual counseling, following couples' therapy, and appropriate social and occupational supports.        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   Medication for managing anxiety and depressed mood is recommended to resume should [name]'s excessive worry or depressed mood become interfering with daily functioning.  Stimulant medication for attention deficits may also be helpful, although stimulants can result in increased anxiety, sleeplessness, or cardiovascular difficulties in middle aged adults.   Individual counseling is recommended to help [name] with managing excessive worry and overly negative thinking while developing social interaction and coping  skills.  Ms. Hayne would benefit from a structured therapy approach that focuses on the teaching of emotional identification and expression, calming, social interaction skills, and perspective taking.  This participation can wait until after [name] and her husband complete marital therapy.   Recommended vocational supports include being able to receive information in small chunks with taking sensory breaks when needed (5-10 minutes per hour).   Mental alertness/energy can also be raised by increasing exercise, improving sleep, eating a healthy diet, and managing depression/stress.  Consult with a physician regarding any changes to physical regimen.  Taking Omega 3 fish oils has also been shown to improve cognitive functioning over time when taking regularly for at least 3-6 months.   Consult the Social Thinking[employer]  website for information and training in social skills as well as seeking local support groups for adults with social communication deficits through the college counseling center or psychology clinic.  Seek social support through an ASD support group well as accessing Elvina Mattes, Ph.D. regarding general information about ASD through either his website or You Tube.  Local support can be garnered through the [employer] in [place].          Salvatore Decent Docia Klar, Ph.D. Licensed Psychologist - [facility] [place] Licensed psychologist [phone] [facility]  Medicine [address]   [place], [place]  [address]  Phone: [phone]  Fax: [phone]   Positive Behavior Support for People with  Neurodevelopmental Disorders  Make a Schedule - Example  Time/Order Activity Picture Comment Completed  7:00am Get Ready for School - Dress, Eat, Brush Teeth  Clothes Put Homework in Folder   8:00am Go to [employer] bus Raise your hand before you speak   3:30pm Return from School and rest Bed or couch Quiet Activities only   4:00pm Start Homework Books Check spelling words   5:30pm Prepare for Dinner Table Set Table  Wash Hands   7:00pm Play/TV Time Toys TV Clean up toys when finished   8:00 Get Ready for Bed -  Pajamas, Brush Teeth, Story Bed In Bed by 8:30     Routines - A set of activities done the same way each time.  Examples  Morning Routine -  Wake up  Go to bathroom  Get dressed  Eat breakfast  Brush teeth  Put on shoes.              Bedtime Routine - Get undressed Put on pajamas Brush teeth Relaxation activity (story or soft music) Get in bed  Cleaning Room Routine - Put toys in toy box Put books in bookshelf Put dirty clothes in hamper Put clean clothes in drawer Make Bed  Homework Routine -  Find quiet area with desk or table Get all needed books and papers Take out one assignment at a time Take a 5 minute break after each assignment Put completed assignments back in folder Put folder in backpack when all assignments completed  A time limit can be used for breaks instead of assignment completion.  A timer can be used. Place more enjoyable activities after the less preferred activities to reinforce participation. Smaller routines can sometimes be combined to form larger routines.   Task Analysis - Breaking activities down into a series of small steps for the person to handle.  Cleaning Room Put toys in toy box Put dirty clothes in hamper Put books on bookshelf Make bed (Making bed can also be  broken down into smaller steps)  Brushing Teeth Run  water Place toothbrush under water Place toothbrush on sink Turn off water Remove toothpaste cap Squeeze toothpaste onto toothbrush Brush teeth Rinse toothbrush Fill cup with water Rinse mouth  Steps can be combined or added as needed depending upon functioning level. Shaping - If a task or activity is too difficult and cannot be broken down any further, have the person do gradually closer approximations to the desired behavior until you get the response that you want.  Sleeping independently Sleep with other person next to bed Sleep with other person in room by door Sleep with other person just outside of door Sleep with other person in next room Sleep without other person  Communicating desire for an object Person leads you to object Person points to object Person points to picture of object Person gives picture of object to you Person vocalizes (any kind of vocalization) while giving picture to you Person makes vocalization that sounds like the correct word Person says the correct word  Scaffolding Gradually expand the person's experiences.  For example, teach new behaviors (one at a time) within the context of a familiar location, routine, and person.  When the person has practiced and is comfortable exhibiting the new behavior in the familiar setting, then have the practice the behavior in a slightly new setting by changing either the location, routine, or person.  Later another aspect of the environment can be changed until the person is able to demonstrate the behavior comfortably in multiple settings, with multiple people, and multiple circumstances.           Visual Prompts Picture Books - Place pictures of common objects, places, people, toys, activities, etc. in a book.  The person can point to the pictures of what they want or they can take the pictures out of the book and hand them to you.  Consult with a  Speech/Language pathologist regarding the most appropriate form of communication for that person.  Picture Schedules - Attach pictures to your schedules and routine lists to help the person understand them better.  Ideas for Creating Pictures:             Website: [email or web address]             Software: Catering manager: Take pictures of common objects, places etc.   Sensory Regulation Avoid place of excess stimulation such as crowded department stores or restaurants.  Go to smaller places or during off peak hours.  If you have to go to a place that is highly stimulating, go for a short time or find a quiet place for the person to go for frequent breaks.  Ways to decrease excess stimulation: Quiet activities or soft music Firm touch such as deep massage or heavy blankets/vests Deep rhythmic breathing Separation from others Ways to increase stimulation - When a person is under stimulated you may notice more odd and repetitive behaviors.  Getting the person involved in a meaningful activity can reduce the occurrence.  Loud noise or music    Light touch Short rapid breaths Spicy foods Other activities such as exercise, art, scents, and swinging can be either stimulating or calming depending upon the person.  Check with an Occupational Therapist about specific sensory activities.   Intervention for when Person Loses Control Caregiver stays calm Person goes to quiet area or other people leave the area so it becomes quiet. Person is left alone to calm  self with only monitoring from the caregiver. There should not be any intervention until the person is calm. Once the person is calm, they can be redirected to another activity, given an alternative behavior perform instead of becoming upset, or have their options explained to them so they can make an appropriate choice. The person can be taught to take 10 deep breaths to assist in calming. The teaching should be  done during times when the person is calm.  They can be reminded one time to use the breathing while upset. Physical restraint should only be used if the person is hurting himself or others. For prolonged behavioral outbursts, caregivers should switch monitoring the person every 15 minutes if possible so the care givers can remain calm themselves. Transition - Steps to help with going from one activity to another: Set a specific time for when the activity will change and inform the person ahead of time.  Make sure they know what the new activity will be and detail any actions they need to do in between such as cleaning.  Use a timer or some other concrete way of letting the person know when the current activity is finished. Give the person a brief warning about 2-3 minutes before the activity is complete so they can mentally prepare for the change. When going to a new place or activity, bringing a familiar object may help ease the person's anxiety.    Reviewing a picture or other schedule with the person prior to the activities can help can give the person advanced warning of changes. Social Stories (brief stories about social situations) can be written with the person to help them understand the concept of changes and about going from one activity to another.   Alternatives - Always give an alternative when the person is not able to get what they want. When a request is denied tell the person what they can have instead. When something is taken away, replace it with something else. If what the person wants is not available, let them know specifically when it will be available.  Use the schedule to show people when they can have what they want.  Reinforcing Positive Behaviors - Let the person know when they have behaved appropriately. Be specific about what they had done and how it was helpful.  E.g. "when you shared your toy with your sister it made her happy." Be careful about using excessive  excitement, praise, or touch (E.g. pats on the back).  Many people with Autism are sensitive to these and may view this as aversive. Stay calm and show positive emotion when giving feedback.  Correcting Inappropriate Behaviors - Let the person know when they have behaved inappropriately and show them a more appropriate behavior.     Wait until the person is calm before applying any correction. The new behavior should help the person achieve the same outcome as the inappropriate behavior, but in a different way. The new behavior should be something the person can do.   Break the action into small steps whenever teaching a new behavior. Help the person practice the new behavior so it can eventually replace the old behavior.     Providing Consequences  Consequences for appropriate and inappropriate behavior can be given under the following circumstances: Make sure the person knows and understands the consequences ahead of time.  Use pictures to demonstrate the consequences if needed.   Have the consequences be consistent with the behavior being exhibited.  Example: person hits  sibling. Right Way - person apologizes, uses words or gentle touch, and performs a positive activity for sibling. Wrong Way - person is sent to their room or has toys taken away   Always follow through on the consequences once they are stated. Provide a balance of positive and negative consequences so they person maintains their self-esteem.  Look for partial elements of positive behaviors if needed.  Salvatore Decent Clanton Emanuelson, Ph.D. Licensed Clinical Psychologist - [facility] Executive System Intervention Overview Executive dysfunction can significantly impact an individual's ability to function at home, at school, at work, or in the community. Several different approaches to executive function intervention have been developed by neuropsychologists, rehabilitation specialists, and others that are aimed  at helping individuals cope with executive dysfunction. One type of intervention involves the application of cognitive remediation techniques that typically emphasize repeated practice with tasks, such as memory and attention tasks, that are intended to improve the deficient skill. This form of intervention has demonstrated some success in treating people with executive dysfunction, such as individuals who have traumatic brain injury.   Another type of intervention involves teaching compensatory strategies. These strategies are designed to circumvent rather than directly improve deficits and also have demonstrated effectiveness in a number of patient populations. Still others emphasize the interaction of the individual within the environment and how antecedent environmental modifications or accommodations can facilitate executive functions. It should be noted that these approaches to dealing with executive dysfunction need not be mutually exclusive and many intervention programs are characterized by a hybrid approach.   Compensatory strategies themselves can take several forms including using external aides (e.g., use of a notebook), learning cognitive strategies (e.g., verbalization), and making environmental modifications (e.g., keeping workspace clutter-free). Research has demonstrated that both healthy adults as well as individuals who have executive deficits commonly rely on external aids for executive and other cognitive processes. The probability of success with compensatory strategies can be enhanced by building on an individual's existing strategies, systematically training the new strategies, and tailoring the compensatory strategies to the individual's unique needs and environmental contexts. More frequent use of aides or strategies and the use of a greater variety of aides is helpful when it comes to memory, and this also may hold true for executive dysfunction.   For individuals with more severe  executive dysfunction and/or those with additional deficits in other domains of functioning, such as memory and learning, assimilating and applying compensatory strategies and aides may be difficult. Providing such individuals with a high degree of external support can help them successfully complete tasks with less error and improve self-esteem. Prolonged reliance on external support without any systematic plan for developing some degree of independent skill, however, may interfere with the individual's ability to learn from new experiences. In many cases, across the range of severity, behavioral change may best be achieved through supportive practice of routines within pertinent "natural" contexts such as the home, where fostering the development of behaviors and thoughts that are elicited by regular cues in the environment is facilitated. This form of compensatory strategy relies on habit formation, also referred to as implicit memory or procedural learning, aspects of which are relatively intact in many conditions where executive dysfunction is common.  For individuals who have very severe cognitive dysfunction, instructing someone other than the individual in question (e.g., caregiver, spouse, teacher, supervisor) on appropriate environmental modifications may be the most helpful approach.  In the context of a systematic approach, some suggested compensatory strategies for dealing  with executive dysfunction follow. These recommendations are generic in nature and can be tailored to individual needs based on severity of deficit, preserved strengths, and environmental demands. It is important to note that the decision to use any given strategy to address executive dysfunction should be based on an appropriate assessment of the individual and tailored accordingly.                   Bryson Dames, PhD

## 2022-08-06 NOTE — Progress Notes (Signed)
                Eran Mistry, PhD 

## 2022-08-14 DIAGNOSIS — F411 Generalized anxiety disorder: Secondary | ICD-10-CM | POA: Diagnosis not present

## 2022-08-14 DIAGNOSIS — F900 Attention-deficit hyperactivity disorder, predominantly inattentive type: Secondary | ICD-10-CM | POA: Diagnosis not present

## 2022-08-28 DIAGNOSIS — F411 Generalized anxiety disorder: Secondary | ICD-10-CM | POA: Diagnosis not present

## 2022-08-28 DIAGNOSIS — F900 Attention-deficit hyperactivity disorder, predominantly inattentive type: Secondary | ICD-10-CM | POA: Diagnosis not present

## 2022-09-11 DIAGNOSIS — F900 Attention-deficit hyperactivity disorder, predominantly inattentive type: Secondary | ICD-10-CM | POA: Diagnosis not present

## 2022-09-11 DIAGNOSIS — F411 Generalized anxiety disorder: Secondary | ICD-10-CM | POA: Diagnosis not present

## 2022-09-25 DIAGNOSIS — F900 Attention-deficit hyperactivity disorder, predominantly inattentive type: Secondary | ICD-10-CM | POA: Diagnosis not present

## 2022-09-25 DIAGNOSIS — F411 Generalized anxiety disorder: Secondary | ICD-10-CM | POA: Diagnosis not present

## 2022-10-09 DIAGNOSIS — F900 Attention-deficit hyperactivity disorder, predominantly inattentive type: Secondary | ICD-10-CM | POA: Diagnosis not present

## 2022-10-09 DIAGNOSIS — F411 Generalized anxiety disorder: Secondary | ICD-10-CM | POA: Diagnosis not present

## 2022-11-05 DIAGNOSIS — F900 Attention-deficit hyperactivity disorder, predominantly inattentive type: Secondary | ICD-10-CM | POA: Diagnosis not present

## 2022-11-05 DIAGNOSIS — F411 Generalized anxiety disorder: Secondary | ICD-10-CM | POA: Diagnosis not present

## 2022-11-07 DIAGNOSIS — Z8742 Personal history of other diseases of the female genital tract: Secondary | ICD-10-CM | POA: Diagnosis not present

## 2022-11-07 DIAGNOSIS — Z0000 Encounter for general adult medical examination without abnormal findings: Secondary | ICD-10-CM | POA: Diagnosis not present

## 2022-11-07 DIAGNOSIS — Z01419 Encounter for gynecological examination (general) (routine) without abnormal findings: Secondary | ICD-10-CM | POA: Diagnosis not present

## 2022-11-13 DIAGNOSIS — K219 Gastro-esophageal reflux disease without esophagitis: Secondary | ICD-10-CM | POA: Diagnosis not present

## 2022-11-13 DIAGNOSIS — E669 Obesity, unspecified: Secondary | ICD-10-CM | POA: Diagnosis not present

## 2022-11-13 DIAGNOSIS — K5900 Constipation, unspecified: Secondary | ICD-10-CM | POA: Diagnosis not present

## 2022-11-13 DIAGNOSIS — Z1211 Encounter for screening for malignant neoplasm of colon: Secondary | ICD-10-CM | POA: Diagnosis not present

## 2022-11-16 DIAGNOSIS — E669 Obesity, unspecified: Secondary | ICD-10-CM | POA: Diagnosis not present

## 2022-11-16 DIAGNOSIS — K5900 Constipation, unspecified: Secondary | ICD-10-CM | POA: Diagnosis not present

## 2022-11-16 DIAGNOSIS — K219 Gastro-esophageal reflux disease without esophagitis: Secondary | ICD-10-CM | POA: Diagnosis not present

## 2022-11-27 DIAGNOSIS — F900 Attention-deficit hyperactivity disorder, predominantly inattentive type: Secondary | ICD-10-CM | POA: Diagnosis not present

## 2022-11-27 DIAGNOSIS — F411 Generalized anxiety disorder: Secondary | ICD-10-CM | POA: Diagnosis not present

## 2022-12-07 DIAGNOSIS — F411 Generalized anxiety disorder: Secondary | ICD-10-CM | POA: Diagnosis not present

## 2022-12-07 DIAGNOSIS — F900 Attention-deficit hyperactivity disorder, predominantly inattentive type: Secondary | ICD-10-CM | POA: Diagnosis not present

## 2022-12-12 DIAGNOSIS — F900 Attention-deficit hyperactivity disorder, predominantly inattentive type: Secondary | ICD-10-CM | POA: Diagnosis not present

## 2022-12-12 DIAGNOSIS — F411 Generalized anxiety disorder: Secondary | ICD-10-CM | POA: Diagnosis not present

## 2022-12-18 DIAGNOSIS — F900 Attention-deficit hyperactivity disorder, predominantly inattentive type: Secondary | ICD-10-CM | POA: Diagnosis not present

## 2022-12-18 DIAGNOSIS — F411 Generalized anxiety disorder: Secondary | ICD-10-CM | POA: Diagnosis not present

## 2022-12-25 DIAGNOSIS — F411 Generalized anxiety disorder: Secondary | ICD-10-CM | POA: Diagnosis not present

## 2022-12-25 DIAGNOSIS — F900 Attention-deficit hyperactivity disorder, predominantly inattentive type: Secondary | ICD-10-CM | POA: Diagnosis not present

## 2023-01-01 DIAGNOSIS — F900 Attention-deficit hyperactivity disorder, predominantly inattentive type: Secondary | ICD-10-CM | POA: Diagnosis not present

## 2023-01-01 DIAGNOSIS — F411 Generalized anxiety disorder: Secondary | ICD-10-CM | POA: Diagnosis not present

## 2023-01-09 DIAGNOSIS — F900 Attention-deficit hyperactivity disorder, predominantly inattentive type: Secondary | ICD-10-CM | POA: Diagnosis not present

## 2023-01-09 DIAGNOSIS — F411 Generalized anxiety disorder: Secondary | ICD-10-CM | POA: Diagnosis not present

## 2023-01-18 ENCOUNTER — Other Ambulatory Visit: Payer: Self-pay | Admitting: Obstetrics and Gynecology

## 2023-01-18 DIAGNOSIS — R922 Inconclusive mammogram: Secondary | ICD-10-CM

## 2023-01-22 DIAGNOSIS — F900 Attention-deficit hyperactivity disorder, predominantly inattentive type: Secondary | ICD-10-CM | POA: Diagnosis not present

## 2023-01-22 DIAGNOSIS — F411 Generalized anxiety disorder: Secondary | ICD-10-CM | POA: Diagnosis not present

## 2023-02-05 DIAGNOSIS — F900 Attention-deficit hyperactivity disorder, predominantly inattentive type: Secondary | ICD-10-CM | POA: Diagnosis not present

## 2023-02-05 DIAGNOSIS — F411 Generalized anxiety disorder: Secondary | ICD-10-CM | POA: Diagnosis not present

## 2023-02-07 ENCOUNTER — Ambulatory Visit
Admission: RE | Admit: 2023-02-07 | Discharge: 2023-02-07 | Disposition: A | Discharge status: Home / Self Care | Payer: BC Managed Care – PPO | Source: Ambulatory Visit | Attending: Obstetrics and Gynecology | Admitting: Obstetrics and Gynecology

## 2023-02-07 ENCOUNTER — Ambulatory Visit
Admission: RE | Admit: 2023-02-07 | Discharge: 2023-02-07 | Disposition: A | Discharge status: Home / Self Care | Payer: Self-pay | Source: Ambulatory Visit | Attending: Obstetrics and Gynecology | Admitting: Obstetrics and Gynecology

## 2023-02-07 DIAGNOSIS — R922 Inconclusive mammogram: Secondary | ICD-10-CM

## 2023-02-07 DIAGNOSIS — N6002 Solitary cyst of left breast: Secondary | ICD-10-CM | POA: Diagnosis not present

## 2023-02-07 DIAGNOSIS — N63 Unspecified lump in unspecified breast: Secondary | ICD-10-CM | POA: Diagnosis not present

## 2023-03-01 DIAGNOSIS — F411 Generalized anxiety disorder: Secondary | ICD-10-CM | POA: Diagnosis not present

## 2023-03-01 DIAGNOSIS — F900 Attention-deficit hyperactivity disorder, predominantly inattentive type: Secondary | ICD-10-CM | POA: Diagnosis not present

## 2023-03-04 DIAGNOSIS — D128 Benign neoplasm of rectum: Secondary | ICD-10-CM | POA: Diagnosis not present

## 2023-03-04 DIAGNOSIS — K6289 Other specified diseases of anus and rectum: Secondary | ICD-10-CM | POA: Diagnosis not present

## 2023-03-04 DIAGNOSIS — K621 Rectal polyp: Secondary | ICD-10-CM | POA: Diagnosis not present

## 2023-03-04 DIAGNOSIS — Z1211 Encounter for screening for malignant neoplasm of colon: Secondary | ICD-10-CM | POA: Diagnosis not present

## 2023-03-06 DIAGNOSIS — M25512 Pain in left shoulder: Secondary | ICD-10-CM | POA: Diagnosis not present

## 2023-03-19 DIAGNOSIS — F411 Generalized anxiety disorder: Secondary | ICD-10-CM | POA: Diagnosis not present

## 2023-03-19 DIAGNOSIS — F900 Attention-deficit hyperactivity disorder, predominantly inattentive type: Secondary | ICD-10-CM | POA: Diagnosis not present

## 2023-03-29 DIAGNOSIS — F411 Generalized anxiety disorder: Secondary | ICD-10-CM | POA: Diagnosis not present

## 2023-03-29 DIAGNOSIS — F900 Attention-deficit hyperactivity disorder, predominantly inattentive type: Secondary | ICD-10-CM | POA: Diagnosis not present

## 2023-04-09 DIAGNOSIS — F411 Generalized anxiety disorder: Secondary | ICD-10-CM | POA: Diagnosis not present

## 2023-04-09 DIAGNOSIS — F900 Attention-deficit hyperactivity disorder, predominantly inattentive type: Secondary | ICD-10-CM | POA: Diagnosis not present

## 2023-04-11 DIAGNOSIS — M25512 Pain in left shoulder: Secondary | ICD-10-CM | POA: Diagnosis not present

## 2023-04-11 DIAGNOSIS — M542 Cervicalgia: Secondary | ICD-10-CM | POA: Diagnosis not present

## 2023-04-23 DIAGNOSIS — F900 Attention-deficit hyperactivity disorder, predominantly inattentive type: Secondary | ICD-10-CM | POA: Diagnosis not present

## 2023-04-23 DIAGNOSIS — F411 Generalized anxiety disorder: Secondary | ICD-10-CM | POA: Diagnosis not present

## 2023-05-07 DIAGNOSIS — F411 Generalized anxiety disorder: Secondary | ICD-10-CM | POA: Diagnosis not present

## 2023-05-07 DIAGNOSIS — F900 Attention-deficit hyperactivity disorder, predominantly inattentive type: Secondary | ICD-10-CM | POA: Diagnosis not present

## 2023-05-21 DIAGNOSIS — F900 Attention-deficit hyperactivity disorder, predominantly inattentive type: Secondary | ICD-10-CM | POA: Diagnosis not present

## 2023-05-21 DIAGNOSIS — F411 Generalized anxiety disorder: Secondary | ICD-10-CM | POA: Diagnosis not present

## 2023-06-04 DIAGNOSIS — F900 Attention-deficit hyperactivity disorder, predominantly inattentive type: Secondary | ICD-10-CM | POA: Diagnosis not present

## 2023-06-04 DIAGNOSIS — F411 Generalized anxiety disorder: Secondary | ICD-10-CM | POA: Diagnosis not present

## 2023-06-18 DIAGNOSIS — F411 Generalized anxiety disorder: Secondary | ICD-10-CM | POA: Diagnosis not present

## 2023-06-18 DIAGNOSIS — F900 Attention-deficit hyperactivity disorder, predominantly inattentive type: Secondary | ICD-10-CM | POA: Diagnosis not present

## 2023-07-23 DIAGNOSIS — F411 Generalized anxiety disorder: Secondary | ICD-10-CM | POA: Diagnosis not present

## 2023-07-23 DIAGNOSIS — F900 Attention-deficit hyperactivity disorder, predominantly inattentive type: Secondary | ICD-10-CM | POA: Diagnosis not present

## 2023-08-13 DIAGNOSIS — F411 Generalized anxiety disorder: Secondary | ICD-10-CM | POA: Diagnosis not present

## 2023-08-13 DIAGNOSIS — F900 Attention-deficit hyperactivity disorder, predominantly inattentive type: Secondary | ICD-10-CM | POA: Diagnosis not present

## 2023-09-03 DIAGNOSIS — F900 Attention-deficit hyperactivity disorder, predominantly inattentive type: Secondary | ICD-10-CM | POA: Diagnosis not present

## 2023-09-03 DIAGNOSIS — F411 Generalized anxiety disorder: Secondary | ICD-10-CM | POA: Diagnosis not present

## 2023-10-03 DIAGNOSIS — F900 Attention-deficit hyperactivity disorder, predominantly inattentive type: Secondary | ICD-10-CM | POA: Diagnosis not present

## 2023-10-03 DIAGNOSIS — F411 Generalized anxiety disorder: Secondary | ICD-10-CM | POA: Diagnosis not present

## 2023-10-22 DIAGNOSIS — F411 Generalized anxiety disorder: Secondary | ICD-10-CM | POA: Diagnosis not present

## 2023-10-22 DIAGNOSIS — F4322 Adjustment disorder with anxiety: Secondary | ICD-10-CM | POA: Diagnosis not present

## 2023-11-08 DIAGNOSIS — F4322 Adjustment disorder with anxiety: Secondary | ICD-10-CM | POA: Diagnosis not present

## 2023-11-08 DIAGNOSIS — F411 Generalized anxiety disorder: Secondary | ICD-10-CM | POA: Diagnosis not present

## 2023-11-25 DIAGNOSIS — F4322 Adjustment disorder with anxiety: Secondary | ICD-10-CM | POA: Diagnosis not present

## 2023-11-25 DIAGNOSIS — F411 Generalized anxiety disorder: Secondary | ICD-10-CM | POA: Diagnosis not present

## 2023-12-09 DIAGNOSIS — F411 Generalized anxiety disorder: Secondary | ICD-10-CM | POA: Diagnosis not present

## 2023-12-09 DIAGNOSIS — F4322 Adjustment disorder with anxiety: Secondary | ICD-10-CM | POA: Diagnosis not present

## 2023-12-20 DIAGNOSIS — F411 Generalized anxiety disorder: Secondary | ICD-10-CM | POA: Diagnosis not present

## 2023-12-20 DIAGNOSIS — F4322 Adjustment disorder with anxiety: Secondary | ICD-10-CM | POA: Diagnosis not present

## 2023-12-27 DIAGNOSIS — F411 Generalized anxiety disorder: Secondary | ICD-10-CM | POA: Diagnosis not present

## 2023-12-27 DIAGNOSIS — F4322 Adjustment disorder with anxiety: Secondary | ICD-10-CM | POA: Diagnosis not present

## 2024-01-02 DIAGNOSIS — F411 Generalized anxiety disorder: Secondary | ICD-10-CM | POA: Diagnosis not present

## 2024-01-03 DIAGNOSIS — F411 Generalized anxiety disorder: Secondary | ICD-10-CM | POA: Diagnosis not present

## 2024-01-03 DIAGNOSIS — F4322 Adjustment disorder with anxiety: Secondary | ICD-10-CM | POA: Diagnosis not present

## 2024-01-10 DIAGNOSIS — F411 Generalized anxiety disorder: Secondary | ICD-10-CM | POA: Diagnosis not present

## 2024-01-10 DIAGNOSIS — F4322 Adjustment disorder with anxiety: Secondary | ICD-10-CM | POA: Diagnosis not present

## 2024-01-17 DIAGNOSIS — F411 Generalized anxiety disorder: Secondary | ICD-10-CM | POA: Diagnosis not present

## 2024-01-17 DIAGNOSIS — F4322 Adjustment disorder with anxiety: Secondary | ICD-10-CM | POA: Diagnosis not present

## 2024-01-24 DIAGNOSIS — F4322 Adjustment disorder with anxiety: Secondary | ICD-10-CM | POA: Diagnosis not present

## 2024-01-24 DIAGNOSIS — F411 Generalized anxiety disorder: Secondary | ICD-10-CM | POA: Diagnosis not present

## 2024-01-30 DIAGNOSIS — F411 Generalized anxiety disorder: Secondary | ICD-10-CM | POA: Diagnosis not present

## 2024-02-13 DIAGNOSIS — F411 Generalized anxiety disorder: Secondary | ICD-10-CM | POA: Diagnosis not present

## 2024-02-13 DIAGNOSIS — F4322 Adjustment disorder with anxiety: Secondary | ICD-10-CM | POA: Diagnosis not present

## 2024-03-09 DIAGNOSIS — R7989 Other specified abnormal findings of blood chemistry: Secondary | ICD-10-CM | POA: Diagnosis not present

## 2024-03-09 DIAGNOSIS — R87610 Atypical squamous cells of undetermined significance on cytologic smear of cervix (ASC-US): Secondary | ICD-10-CM | POA: Diagnosis not present

## 2024-03-09 DIAGNOSIS — Z139 Encounter for screening, unspecified: Secondary | ICD-10-CM | POA: Diagnosis not present

## 2024-03-09 DIAGNOSIS — E785 Hyperlipidemia, unspecified: Secondary | ICD-10-CM | POA: Diagnosis not present

## 2024-03-09 DIAGNOSIS — Z01419 Encounter for gynecological examination (general) (routine) without abnormal findings: Secondary | ICD-10-CM | POA: Diagnosis not present

## 2024-03-12 DIAGNOSIS — F411 Generalized anxiety disorder: Secondary | ICD-10-CM | POA: Diagnosis not present

## 2024-03-12 DIAGNOSIS — F4322 Adjustment disorder with anxiety: Secondary | ICD-10-CM | POA: Diagnosis not present

## 2024-03-26 DIAGNOSIS — F411 Generalized anxiety disorder: Secondary | ICD-10-CM | POA: Diagnosis not present

## 2024-03-26 DIAGNOSIS — F4322 Adjustment disorder with anxiety: Secondary | ICD-10-CM | POA: Diagnosis not present

## 2024-04-09 DIAGNOSIS — F4322 Adjustment disorder with anxiety: Secondary | ICD-10-CM | POA: Diagnosis not present

## 2024-04-09 DIAGNOSIS — F411 Generalized anxiety disorder: Secondary | ICD-10-CM | POA: Diagnosis not present

## 2024-05-20 DIAGNOSIS — F411 Generalized anxiety disorder: Secondary | ICD-10-CM | POA: Diagnosis not present

## 2024-05-20 DIAGNOSIS — F4322 Adjustment disorder with anxiety: Secondary | ICD-10-CM | POA: Diagnosis not present

## 2024-06-02 DIAGNOSIS — Z6830 Body mass index (BMI) 30.0-30.9, adult: Secondary | ICD-10-CM | POA: Diagnosis not present

## 2024-06-02 DIAGNOSIS — F411 Generalized anxiety disorder: Secondary | ICD-10-CM | POA: Diagnosis not present

## 2024-06-02 DIAGNOSIS — E669 Obesity, unspecified: Secondary | ICD-10-CM | POA: Diagnosis not present

## 2024-06-24 DIAGNOSIS — F411 Generalized anxiety disorder: Secondary | ICD-10-CM | POA: Diagnosis not present

## 2024-06-24 DIAGNOSIS — F4322 Adjustment disorder with anxiety: Secondary | ICD-10-CM | POA: Diagnosis not present

## 2024-08-05 DIAGNOSIS — F4322 Adjustment disorder with anxiety: Secondary | ICD-10-CM | POA: Diagnosis not present

## 2024-08-23 DIAGNOSIS — Z23 Encounter for immunization: Secondary | ICD-10-CM | POA: Diagnosis not present

## 2024-08-23 DIAGNOSIS — S61012A Laceration without foreign body of left thumb without damage to nail, initial encounter: Secondary | ICD-10-CM | POA: Diagnosis not present

## 2024-08-31 DIAGNOSIS — F411 Generalized anxiety disorder: Secondary | ICD-10-CM | POA: Diagnosis not present

## 2024-08-31 DIAGNOSIS — F4322 Adjustment disorder with anxiety: Secondary | ICD-10-CM | POA: Diagnosis not present

## 2024-09-07 DIAGNOSIS — S61402D Unspecified open wound of left hand, subsequent encounter: Secondary | ICD-10-CM | POA: Diagnosis not present

## 2024-09-07 DIAGNOSIS — S61402S Unspecified open wound of left hand, sequela: Secondary | ICD-10-CM | POA: Diagnosis not present

## 2024-09-07 DIAGNOSIS — S66822D Laceration of other specified muscles, fascia and tendons at wrist and hand level, left hand, subsequent encounter: Secondary | ICD-10-CM | POA: Diagnosis not present

## 2024-09-07 DIAGNOSIS — S61012A Laceration without foreign body of left thumb without damage to nail, initial encounter: Secondary | ICD-10-CM | POA: Diagnosis not present

## 2024-09-07 DIAGNOSIS — S66822S Laceration of other specified muscles, fascia and tendons at wrist and hand level, left hand, sequela: Secondary | ICD-10-CM | POA: Diagnosis not present

## 2024-09-15 DIAGNOSIS — S61012A Laceration without foreign body of left thumb without damage to nail, initial encounter: Secondary | ICD-10-CM | POA: Diagnosis not present

## 2024-09-15 DIAGNOSIS — S66022A Laceration of long flexor muscle, fascia and tendon of left thumb at wrist and hand level, initial encounter: Secondary | ICD-10-CM | POA: Diagnosis not present
